# Patient Record
Sex: Male | Born: 1945 | Race: White | Hispanic: No | Marital: Single | Smoking: Never smoker
Health system: Southern US, Community
[De-identification: ages and names within clinical notes are randomized; demographics above are authoritative.]

## PROBLEM LIST (undated history)

## (undated) DIAGNOSIS — E119 Type 2 diabetes mellitus without complications: Secondary | ICD-10-CM

## (undated) DIAGNOSIS — E039 Hypothyroidism, unspecified: Secondary | ICD-10-CM

## (undated) DIAGNOSIS — I10 Essential (primary) hypertension: Secondary | ICD-10-CM

## (undated) HISTORY — PX: OTHER SURGICAL HISTORY: SHX169

---

## 2014-12-26 ENCOUNTER — Inpatient Hospital Stay (HOSPITAL_COMMUNITY): Payer: Medicare Other

## 2014-12-26 ENCOUNTER — Encounter (HOSPITAL_COMMUNITY): Payer: Self-pay | Admitting: Internal Medicine

## 2014-12-26 ENCOUNTER — Inpatient Hospital Stay (HOSPITAL_COMMUNITY)
Admission: EM | Admit: 2014-12-26 | Discharge: 2015-01-07 | DRG: 682 | Disposition: A | Discharge status: Skilled Nursing Facility | Payer: Medicare Other | Source: Other Acute Inpatient Hospital | Attending: Internal Medicine | Admitting: Internal Medicine

## 2014-12-26 DIAGNOSIS — Z4659 Encounter for fitting and adjustment of other gastrointestinal appliance and device: Secondary | ICD-10-CM | POA: Insufficient documentation

## 2014-12-26 DIAGNOSIS — Z9119 Patient's noncompliance with other medical treatment and regimen: Secondary | ICD-10-CM | POA: Diagnosis present

## 2014-12-26 DIAGNOSIS — I10 Essential (primary) hypertension: Secondary | ICD-10-CM | POA: Diagnosis present

## 2014-12-26 DIAGNOSIS — E039 Hypothyroidism, unspecified: Secondary | ICD-10-CM | POA: Diagnosis present

## 2014-12-26 DIAGNOSIS — D638 Anemia in other chronic diseases classified elsewhere: Secondary | ICD-10-CM | POA: Diagnosis present

## 2014-12-26 DIAGNOSIS — Z515 Encounter for palliative care: Secondary | ICD-10-CM | POA: Diagnosis not present

## 2014-12-26 DIAGNOSIS — E669 Obesity, unspecified: Secondary | ICD-10-CM | POA: Diagnosis present

## 2014-12-26 DIAGNOSIS — G9341 Metabolic encephalopathy: Secondary | ICD-10-CM | POA: Diagnosis not present

## 2014-12-26 DIAGNOSIS — J9600 Acute respiratory failure, unspecified whether with hypoxia or hypercapnia: Secondary | ICD-10-CM | POA: Diagnosis not present

## 2014-12-26 DIAGNOSIS — Z794 Long term (current) use of insulin: Secondary | ICD-10-CM | POA: Diagnosis not present

## 2014-12-26 DIAGNOSIS — Z7189 Other specified counseling: Secondary | ICD-10-CM | POA: Insufficient documentation

## 2014-12-26 DIAGNOSIS — N179 Acute kidney failure, unspecified: Secondary | ICD-10-CM | POA: Diagnosis present

## 2014-12-26 DIAGNOSIS — Z8673 Personal history of transient ischemic attack (TIA), and cerebral infarction without residual deficits: Secondary | ICD-10-CM

## 2014-12-26 DIAGNOSIS — I69354 Hemiplegia and hemiparesis following cerebral infarction affecting left non-dominant side: Secondary | ICD-10-CM

## 2014-12-26 DIAGNOSIS — Z833 Family history of diabetes mellitus: Secondary | ICD-10-CM | POA: Diagnosis not present

## 2014-12-26 DIAGNOSIS — E1165 Type 2 diabetes mellitus with hyperglycemia: Secondary | ICD-10-CM | POA: Diagnosis present

## 2014-12-26 DIAGNOSIS — N189 Chronic kidney disease, unspecified: Secondary | ICD-10-CM | POA: Diagnosis not present

## 2014-12-26 DIAGNOSIS — J690 Pneumonitis due to inhalation of food and vomit: Secondary | ICD-10-CM | POA: Diagnosis not present

## 2014-12-26 DIAGNOSIS — T17908A Unspecified foreign body in respiratory tract, part unspecified causing other injury, initial encounter: Secondary | ICD-10-CM | POA: Insufficient documentation

## 2014-12-26 DIAGNOSIS — E875 Hyperkalemia: Secondary | ICD-10-CM | POA: Diagnosis present

## 2014-12-26 DIAGNOSIS — E871 Hypo-osmolality and hyponatremia: Secondary | ICD-10-CM | POA: Diagnosis present

## 2014-12-26 DIAGNOSIS — M109 Gout, unspecified: Secondary | ICD-10-CM | POA: Diagnosis present

## 2014-12-26 DIAGNOSIS — R0981 Nasal congestion: Secondary | ICD-10-CM | POA: Insufficient documentation

## 2014-12-26 DIAGNOSIS — IMO0002 Reserved for concepts with insufficient information to code with codable children: Secondary | ICD-10-CM | POA: Diagnosis present

## 2014-12-26 DIAGNOSIS — E872 Acidosis: Secondary | ICD-10-CM | POA: Diagnosis not present

## 2014-12-26 DIAGNOSIS — Z7982 Long term (current) use of aspirin: Secondary | ICD-10-CM | POA: Diagnosis not present

## 2014-12-26 DIAGNOSIS — Z6827 Body mass index (BMI) 27.0-27.9, adult: Secondary | ICD-10-CM | POA: Diagnosis not present

## 2014-12-26 DIAGNOSIS — Z992 Dependence on renal dialysis: Secondary | ICD-10-CM

## 2014-12-26 DIAGNOSIS — J9690 Respiratory failure, unspecified, unspecified whether with hypoxia or hypercapnia: Secondary | ICD-10-CM

## 2014-12-26 DIAGNOSIS — E8770 Fluid overload, unspecified: Secondary | ICD-10-CM | POA: Diagnosis present

## 2014-12-26 DIAGNOSIS — D631 Anemia in chronic kidney disease: Secondary | ICD-10-CM | POA: Diagnosis present

## 2014-12-26 DIAGNOSIS — Z8249 Family history of ischemic heart disease and other diseases of the circulatory system: Secondary | ICD-10-CM

## 2014-12-26 DIAGNOSIS — N178 Other acute kidney failure: Secondary | ICD-10-CM | POA: Diagnosis not present

## 2014-12-26 HISTORY — DX: Type 2 diabetes mellitus without complications: E11.9

## 2014-12-26 HISTORY — DX: Essential (primary) hypertension: I10

## 2014-12-26 HISTORY — DX: Hypothyroidism, unspecified: E03.9

## 2014-12-26 LAB — CBC WITH DIFFERENTIAL/PLATELET
Basophils Absolute: 0 10*3/uL (ref 0.0–0.1)
Basophils Relative: 0 % (ref 0–1)
Eosinophils Absolute: 0 10*3/uL (ref 0.0–0.7)
Eosinophils Relative: 0 % (ref 0–5)
HCT: 28 % — ABNORMAL LOW (ref 39.0–52.0)
Hemoglobin: 9.1 g/dL — ABNORMAL LOW (ref 13.0–17.0)
LYMPHS PCT: 5 % — AB (ref 12–46)
Lymphs Abs: 0.7 10*3/uL (ref 0.7–4.0)
MCH: 28.5 pg (ref 26.0–34.0)
MCHC: 32.5 g/dL (ref 30.0–36.0)
MCV: 87.8 fL (ref 78.0–100.0)
MONO ABS: 0.1 10*3/uL (ref 0.1–1.0)
Monocytes Relative: 1 % — ABNORMAL LOW (ref 3–12)
NEUTROS PCT: 94 % — AB (ref 43–77)
Neutro Abs: 12.9 10*3/uL — ABNORMAL HIGH (ref 1.7–7.7)
Platelets: 186 10*3/uL (ref 150–400)
RBC: 3.19 MIL/uL — AB (ref 4.22–5.81)
RDW: 16 % — AB (ref 11.5–15.5)
WBC: 13.7 10*3/uL — ABNORMAL HIGH (ref 4.0–10.5)

## 2014-12-26 LAB — COMPREHENSIVE METABOLIC PANEL
ALT: 14 U/L (ref 0–53)
AST: 14 U/L (ref 0–37)
Albumin: 2.7 g/dL — ABNORMAL LOW (ref 3.5–5.2)
Alkaline Phosphatase: 45 U/L (ref 39–117)
Anion gap: 15 (ref 5–15)
BUN: 91 mg/dL — ABNORMAL HIGH (ref 6–23)
CO2: 11 mmol/L — ABNORMAL LOW (ref 19–32)
Calcium: 6.3 mg/dL — CL (ref 8.4–10.5)
Chloride: 111 mEq/L (ref 96–112)
Creatinine, Ser: 5.95 mg/dL — ABNORMAL HIGH (ref 0.50–1.35)
GFR calc Af Amer: 10 mL/min — ABNORMAL LOW (ref >90)
GFR calc non Af Amer: 9 mL/min — ABNORMAL LOW (ref >90)
GLUCOSE: 293 mg/dL — AB (ref 70–99)
POTASSIUM: 5.2 mmol/L — AB (ref 3.5–5.1)
Sodium: 137 mmol/L (ref 135–145)
TOTAL PROTEIN: 5.5 g/dL — AB (ref 6.0–8.3)
Total Bilirubin: 0.5 mg/dL (ref 0.3–1.2)

## 2014-12-26 LAB — BLOOD GAS, ARTERIAL
Acid-base deficit: 14.6 mmol/L — ABNORMAL HIGH (ref 0.0–2.0)
Bicarbonate: 11.8 mEq/L — ABNORMAL LOW (ref 20.0–24.0)
Drawn by: 418751
FIO2: 0.32 %
O2 Saturation: 97.9 %
PATIENT TEMPERATURE: 98.6
PCO2 ART: 30.9 mmHg — AB (ref 35.0–45.0)
PO2 ART: 130 mmHg — AB (ref 80.0–100.0)
TCO2: 12.7 mmol/L (ref 0–100)
pH, Arterial: 7.206 — ABNORMAL LOW (ref 7.350–7.450)

## 2014-12-26 LAB — MRSA PCR SCREENING: MRSA BY PCR: NEGATIVE

## 2014-12-26 LAB — LACTIC ACID, PLASMA: Lactic Acid, Venous: 1.3 mmol/L (ref 0.5–2.2)

## 2014-12-26 LAB — PHOSPHORUS: Phosphorus: 9.7 mg/dL — ABNORMAL HIGH (ref 2.3–4.6)

## 2014-12-26 MED ORDER — RENA-VITE PO TABS
1.0000 | ORAL_TABLET | Freq: Every day | ORAL | Status: DC
  Administered 2014-12-26 – 2015-01-06 (×9): 1 via ORAL
  Filled 2014-12-26 (×15): qty 1

## 2014-12-26 MED ORDER — CALCIUM ACETATE 667 MG PO CAPS
1334.0000 mg | ORAL_CAPSULE | Freq: Three times a day (TID) | ORAL | Status: DC
  Administered 2014-12-27 – 2015-01-05 (×19): 1334 mg via ORAL
  Filled 2014-12-26 (×31): qty 2

## 2014-12-26 MED ORDER — INSULIN ASPART 100 UNIT/ML ~~LOC~~ SOLN
0.0000 [IU] | Freq: Three times a day (TID) | SUBCUTANEOUS | Status: DC
  Administered 2014-12-27: 3 [IU] via SUBCUTANEOUS
  Administered 2014-12-27 (×2): 2 [IU] via SUBCUTANEOUS

## 2014-12-26 MED ORDER — FUROSEMIDE 10 MG/ML IJ SOLN
120.0000 mg | Freq: Three times a day (TID) | INTRAMUSCULAR | Status: DC
  Administered 2014-12-26 – 2014-12-29 (×8): 120 mg via INTRAVENOUS
  Administered 2014-12-29: 07:00:00 via INTRAVENOUS
  Administered 2014-12-29 – 2014-12-30 (×4): 120 mg via INTRAVENOUS
  Filled 2014-12-26 (×16): qty 12

## 2014-12-26 MED ORDER — LEVOTHYROXINE SODIUM 50 MCG PO TABS
50.0000 ug | ORAL_TABLET | Freq: Every day | ORAL | Status: DC
  Administered 2014-12-27: 50 ug via ORAL
  Filled 2014-12-26 (×3): qty 1

## 2014-12-26 MED ORDER — ASPIRIN EC 325 MG PO TBEC
325.0000 mg | DELAYED_RELEASE_TABLET | Freq: Every evening | ORAL | Status: DC
  Administered 2014-12-26 – 2015-01-06 (×9): 325 mg via ORAL
  Filled 2014-12-26 (×14): qty 1

## 2014-12-26 MED ORDER — ACETAMINOPHEN 325 MG PO TABS: 650.0000 mg | ORAL_TABLET | Freq: Four times a day (QID) | ORAL | Status: DC | PRN

## 2014-12-26 MED ORDER — ONDANSETRON HCL 4 MG PO TABS: 4.0000 mg | ORAL_TABLET | Freq: Four times a day (QID) | ORAL | Status: DC | PRN

## 2014-12-26 MED ORDER — ACETAMINOPHEN 650 MG RE SUPP: 650.0000 mg | Freq: Four times a day (QID) | RECTAL | Status: DC | PRN

## 2014-12-26 MED ORDER — ONDANSETRON HCL 4 MG/2ML IJ SOLN: 4.0000 mg | Freq: Four times a day (QID) | INTRAMUSCULAR | Status: DC | PRN

## 2014-12-26 MED ORDER — CEFTRIAXONE SODIUM IN DEXTROSE 20 MG/ML IV SOLN
1.0000 g | INTRAVENOUS | Status: DC
  Administered 2014-12-26 – 2014-12-27 (×2): 1 g via INTRAVENOUS
  Filled 2014-12-26 (×3): qty 50

## 2014-12-26 MED ORDER — ATENOLOL 50 MG PO TABS
50.0000 mg | ORAL_TABLET | Freq: Every evening | ORAL | Status: DC
  Administered 2014-12-26 – 2014-12-27 (×2): 50 mg via ORAL
  Filled 2014-12-26 (×3): qty 1

## 2014-12-26 MED ORDER — HEPARIN SODIUM (PORCINE) 5000 UNIT/ML IJ SOLN
5000.0000 [IU] | Freq: Three times a day (TID) | INTRAMUSCULAR | Status: DC
  Administered 2014-12-26 – 2015-01-07 (×27): 5000 [IU] via SUBCUTANEOUS
  Filled 2014-12-26 (×39): qty 1

## 2014-12-26 MED ORDER — SIMVASTATIN 20 MG PO TABS
20.0000 mg | ORAL_TABLET | Freq: Every evening | ORAL | Status: DC
  Administered 2014-12-26 – 2014-12-27 (×2): 20 mg via ORAL
  Filled 2014-12-26 (×3): qty 1

## 2014-12-26 MED ORDER — INSULIN GLARGINE 100 UNIT/ML ~~LOC~~ SOLN
5.0000 [IU] | Freq: Every day | SUBCUTANEOUS | Status: DC
  Administered 2014-12-26: 5 [IU] via SUBCUTANEOUS
  Filled 2014-12-26 (×2): qty 0.05

## 2014-12-26 MED ORDER — KIDNEY FAILURE BOOK
Freq: Once | Status: AC
  Administered 2014-12-27: 01:00:00
  Filled 2014-12-26: qty 1

## 2014-12-26 MED ORDER — DEXTROSE 5 % IV SOLN
500.0000 mg | INTRAVENOUS | Status: DC
  Administered 2014-12-26 – 2014-12-27 (×2): 500 mg via INTRAVENOUS
  Filled 2014-12-26 (×3): qty 500

## 2014-12-26 MED ORDER — SODIUM CHLORIDE 0.9 % IJ SOLN
3.0000 mL | Freq: Two times a day (BID) | INTRAMUSCULAR | Status: DC
  Administered 2014-12-26 – 2015-01-05 (×18): 3 mL via INTRAVENOUS

## 2014-12-26 NOTE — Consult Note (Signed)
Reason for Consult:ARF, metabolic acidosis, hyperkalemia Referring Physician: Dr. Sabino [name] is an 69 y.o. male.  HPI: Pt is [name] with PMH significant for longstanding, poorly controlled DM, HTN, and CVA with left hemiparesis who presented to [facility] on 12/24/14 with [name] 1 month h/o worsening lower extremity swelling, weakness, and malaise.  He was admitted to [facility] after he was noted to have anasarca, CHF, anemia, ARF and right lower lobe pneumonia.  His labs were significant for [name] hgb 7.6, Scr of 6.1, and [name] CO2 of 10.  He was given [name] blood transfusion and started to develop worsening SOB and was transferred to [facility] for possible hemodialysis as his renal function was not improving and os his worsening pulmonary edema.  Renal ultrasound performed at [facility] revealed no hydronephrosis, Left Kidney 11.2cm, Right Kidney 11.1 with normal echogenicity.  He also had bladder wall thickening and probable non-obstructing stone in right kidney.  He is [name] poor historian but reports that he saw [name] Dr. Alvester Morin in [place], [place] and had labs drawn about [name] year ago.  He denies any mention of kidney disease but did remember that Dr. Alvester Morin told him to take less Alleve so he could try to avoid needing dialysis.  He has no family history of kidney disease.  Also of note, he was taking benazepril prior to admission.  We only have 3 days worth of lab data available at this time and his Scr trend is seen below:  Trend in Creatinine: CREATININE, SER  Date/Time Value Ref Range Status  12/26/2014 08:41 PM 5.95* 0.50 - 1.35 mg/dL Final  2/35/5732 20:25 AM 5.9    12/24/2014  22:25 PM 6.1      PMH:   Past Medical History  Diagnosis Date  . Hypertension   . Hypothyroidism   . Diabetes mellitus without complication     PSH:   Past Surgical History  Procedure Laterality Date  . Chest tube placement      Allergies: No Known Allergies  Medications:   Prior to Admission medications   Medication  Sig Start Date End Date Taking? Authorizing Provider  allopurinol (ZYLOPRIM) 300 MG tablet Take 300 mg by mouth every evening.   Yes Historical Provider, MD  aspirin EC 325 MG tablet Take 325 mg by mouth every evening.   Yes Historical Provider, MD  atenolol (TENORMIN) 50 MG tablet Take 50 mg by mouth every evening.   Yes Historical Provider, MD  benazepril (LOTENSIN) 40 MG tablet Take 40 mg by mouth every evening.   Yes Historical Provider, MD  furosemide (LASIX) 40 MG tablet Take 40 mg by mouth every evening.   Yes Historical Provider, MD  insulin aspart (NOVOLOG FLEXPEN) 100 UNIT/ML FlexPen Inject 2-10 Units into the skin daily with supper. Per sliding scale   Yes Historical Provider, MD  Insulin Glargine (LANTUS SOLOSTAR) 100 UNIT/ML Solostar Pen Inject 18 Units into the skin at bedtime. If blood sugar is >150   Yes Historical Provider, MD  Lactobacillus (ACIDOPHILUS PO) Take 1 tablet by mouth 2 (two) times daily.   Yes Historical Provider, MD  levothyroxine (SYNTHROID, LEVOTHROID) 50 MCG tablet Take 50 mcg by mouth every evening.   Yes Historical Provider, MD  OVER THE COUNTER MEDICATION Take 2 tablets by mouth every evening. Supplement to keep from having heart attack or stroke   Yes Historical Provider, MD  simvastatin (ZOCOR) 20 MG tablet Take 20 mg by mouth every evening.   Yes  Historical Provider, MD    Inpatient medications: . aspirin EC  325 mg Oral QPM  . atenolol  50 mg Oral QPM  . azithromycin  500 mg Intravenous Q24H  . cefTRIAXone (ROCEPHIN)  IV  1 g Intravenous Q24H  . furosemide  120 mg Intravenous Q8H  . heparin  5,000 Units Subcutaneous 3 times per day  . [START ON 12/27/2014] insulin aspart  0-9 Units Subcutaneous TID WC  . insulin glargine  5 Units Subcutaneous QHS  . [START ON 12/27/2014] levothyroxine  50 mcg Oral QAC breakfast  . simvastatin  20 mg Oral QPM  . sodium chloride  3 mL Intravenous Q12H    Discontinued Meds:   Medications Discontinued During This  Encounter  Medication Reason  . SIMVASTATIN PO Entry Error  . FUROSEMIDE PO Entry Error  . ATENOLOL PO Entry Error  . ALLOPURINOL PO Entry Error  . BENAZEPRIL HCL PO Entry Error    Social History:  reports that he has never smoked. He does not have any smokeless tobacco history on file. He reports that he drinks alcohol. His drug history is not on file.  Family History:   Family History  Problem Relation Age of Onset  . Diabetes Mellitus II Father   . CAD Father     Pertinent items are noted in HPI. Weight change:   Intake/Output Summary (Last 24 hours) at 12/26/14 2202 Last data filed at 12/26/14 2100  Gross per 24 hour  Intake      0 ml  Output    100 ml  Net   -100 ml   BP 153/86 mmHg  Pulse 80  Temp(Src) 98.3 F (36.8 C) (Oral)  Resp 16  Ht $R'5\' 8"'mh$  (1.727 m)  Wt 100.3 kg (221 lb 1.9 oz)  BMI 33.63 kg/m2  SpO2 99% Filed Vitals:   12/26/14 2000  BP: 153/86  Pulse: 80  Temp: 98.3 F (36.8 C)  TempSrc: Oral  Resp: 16  Height: $Remove'5\' 8"'gasEzFo$  (1.727 m)  Weight: 100.3 kg (221 lb 1.9 oz)  SpO2: 99%     General appearance: fatigued, no distress and slowed mentation Head: Normocephalic, without obvious abnormality, atraumatic Eyes: negative findings: lids and lashes normal, conjunctivae and sclerae normal and corneas clear Resp: rales bilaterally and rhonchi bilaterally Cardio: regular rate and rhythm and no rub GI: soft, non-tender; bowel sounds normal; no masses,  no organomegaly Extremities: edema 3+ pitting edema to umbillicus and multiple sores on left hand and lower extremities in various stages of healing Neurologic: Motor: left-sided weakness  Labs: Basic Metabolic Panel:  Recent Labs Lab 12/26/14 2041  NA 137  K 5.2*  CL 111  CO2 11*  GLUCOSE 293*  BUN 91*  CREATININE 5.95*  ALBUMIN 2.7*  CALCIUM 6.3*  PHOS 9.7*   Liver Function Tests:  Recent Labs Lab 12/26/14 2041  AST 14  ALT 14  ALKPHOS 45  BILITOT 0.5  PROT 5.5*  ALBUMIN 2.7*   No  results for input(s): LIPASE, AMYLASE in the last 168 hours. No results for input(s): AMMONIA in the last 168 hours. CBC:  Recent Labs Lab 12/26/14 2041  WBC 13.7*  NEUTROABS 12.9*  HGB 9.1*  HCT 28.0*  MCV 87.8  PLT 186   PT/INR: $Remove'@LABRCNTIP'YbEkJAb$ (inr:5) Cardiac Enzymes: )No results for input(s): CKTOTAL, CKMB, CKMBINDEX, TROPONINI in the last 168 hours. CBG: No results for input(s): GLUCAP in the last 168 hours.  Iron Studies: No results for input(s): IRON, TIBC, TRANSFERRIN, FERRITIN in the last 168  hours.  Xrays/Other Studies: Dg Chest Port 1 View  12/26/2014   CLINICAL DATA:  Fluid overload.  Shortness of breath.  EXAM: PORTABLE CHEST - 1 VIEW  COMPARISON:  Earlier the same day at 05:12 hr  FINDINGS: Portable AP view at 1956 hr: Low lung volumes persist. There is improving right basilar aeration with decreasing right basilar airspace disease. Unchanged left basilar airspace disease. Pulmonary vascular congestion, unchanged from prior. Cardiomediastinal contours are unchanged. Question small bilateral pleural effusions.  IMPRESSION: Improving right basilar aeration with decreasing airspace opacity. Question small bilateral pleural effusions.   Electronically Signed   By: Jeb Levering M.D.   On: 12/26/2014 20:37     Assessment/Plan: 1.  ARF vs. AKI/CKD vs. Chronic progression of CKD- pt is [name] poor historian and no other labs are available for review, however he has [name] long history of HTN (with evidence of LVH), and poorly controlled DM along with normocytic anemia.   1. Will order some serologies and urine studies but will definitely need to obtain his medical records from Dr. Gloriann Loan in [place], [place] to assist in determining the chronicity/severity of his kidney disease.   2. AKI/CKD is most likely related to diabetes and HTN in setting of ACE-Inhibitor and NSAIDs as well as decompensated CHF.  Will also need to r/o multiple myeloma 2. Hyperkalemia- related to #1 1. On bicarb replacement  as well as lasix, will follow 3. Metabolic acidosis- due to #1 1. On bicarb 4. CHF- agree with lasix $RemoveBe'120mg'fVztOFpgl$  IV tid and follow UOP, daily weights and Scr 5. Anemia- normocytic.  R/o multiple myeloma and follow H/H 6. HTN- agree with holding ACE but would resume other BP meds 7. 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 Pulmonary HTN- moderate on ECHO, ?OSA 10. CAP- per primary svc (or possibly asymmetric pulmonary edema), will follow CXR after diuresis 11. CVA with left-sided weakness 12. Hypothyroidism- TSH 8.66 on synthroid   Edward [name] 12/26/2014, 10:02 PM

## 2014-12-26 NOTE — H&P (Signed)
[facility] History and Physical  Edward SonsKenneth L Goar AVW:[id number] DOB: 10/24/1946 DOA: 12/26/2014  Referring physician: Patient was [facility]. PCP: Kendell BaneBELL, WILLIAM, MD   Chief Complaint: Worsening renal function.  HPI: Edward [name] is a 69 y.o. male with history of CVA and left-sided hemiparesis, diabetes mellitus type 2, hypertension and hypothyroidism was taken to [facility] 2 days ago after patient has been having increasing weakness with lower extremity edema. Patient was found to have elevated creatinine and low hemoglobin. His textile also was showing which is concerning for pneumonia. Patient did receive 2 units of PRBC at [facility] for the anemia. Patient's respiratory status got worse and and was given Lasix and transferred to [facility] for further management of the patient's worsening renal function. [facility]D echo done [facility] showed EF of 55-60% with moderate pulmonary hypertension and concentrate LVH. Renal sonogram showed no hydronephrosis. Hemoglobin on admission was 7.6. Creatinine was 6.1 with a bicarbonate of 13 and ABG showing a pH of 7.1. Patient states he is not aware of patient having any kidney problems. Patient's home medication list shows that patient is on ACE inhibitor. On exam patient is not in distress but does have gurgling breath sounds. Patient has significant edema in the both lower extremities and has distended abdomen.   Review of Systems: As presented in the history of presenting illness, rest negative.  Past Medical History  Diagnosis Date  . Hypertension   . Hypothyroidism   . Diabetes mellitus without complication    Past Surgical History  Procedure Laterality Date  . Chest tube placement     Social History:  reports that he has never smoked. He does not have any smokeless tobacco history on file. He reports that he drinks alcohol. His drug history is not on file. Where does patient live home. Can  patient participate in ADLs? Yes.  No Known Allergies  Family History:  Family History  Problem Relation Age of Onset  . Diabetes Mellitus II Father   . CAD Father       Prior to Admission medications   Medication Sig Start Date End Date Taking? Authorizing Provider  allopurinol (ZYLOPRIM) 300 MG tablet Take 300 mg by mouth every evening.   Yes Historical Provider, MD  aspirin EC 325 MG tablet Take 325 mg by mouth every evening.   Yes Historical Provider, MD  atenolol (TENORMIN) 50 MG tablet Take 50 mg by mouth every evening.   Yes Historical Provider, MD  benazepril (LOTENSIN) 40 MG tablet Take 40 mg by mouth every evening.   Yes Historical Provider, MD  furosemide (LASIX) 40 MG tablet Take 40 mg by mouth every evening.   Yes Historical Provider, MD  insulin aspart (NOVOLOG FLEXPEN) 100 UNIT/ML FlexPen Inject 2-10 Units into the skin daily with supper. Per sliding scale   Yes Historical Provider, MD  Insulin Glargine (LANTUS SOLOSTAR) 100 UNIT/ML Solostar Pen Inject 18 Units into the skin at bedtime. If blood sugar is >150   Yes Historical Provider, MD  Lactobacillus (ACIDOPHILUS PO) Take 1 tablet by mouth 2 (two) times daily.   Yes Historical Provider, MD  levothyroxine (SYNTHROID, LEVOTHROID) 50 MCG tablet Take 50 mcg by mouth every evening.   Yes Historical Provider, MD  OVER THE COUNTER MEDICATION Take 2 tablets by mouth every evening. Supplement to keep from having heart attack or stroke   Yes Historical Provider, MD  simvastatin (ZOCOR) 20 MG tablet Take 20 mg by mouth every evening.  Yes Historical Provider, MD    Physical Exam: Filed Vitals:   12/26/14 2000  BP: 153/86  Pulse: 80  Temp: 98.3 F (36.8 C)  TempSrc: Oral  Resp: 16  Height:  (1.727 m)  Weight: 100.3 kg (221 lb 1.9 oz)  SpO2: 99%     General:  Moderately built and nourished.  Eyes: Anicteric no pallor.  ENT: No discharge from ears eyes nose or mouth.  Neck: No mass felt. Mildly elevated  JVD.  Cardiovascular: S1 and S2 heard.  Respiratory: No rhonchi or crepitations.  Abdomen: Mildly distended no guarding or rigidity.  Skin: Multiple scratches which patient states he got from his cats.  Musculoskeletal: Bilateral lower extremity edema.  Psychiatric: Appears normal.  Neurologic: Alert awake oriented to time place and person. Has left-sided hemiparesis from a previous stroke.  Labs on Admission:  Basic Metabolic Panel: No results for input(s): NA, K, CL, CO2, GLUCOSE, BUN, CREATININE, CALCIUM, MG, PHOS in the last 168 hours. Liver Function Tests: No results for input(s): AST, ALT, ALKPHOS, BILITOT, PROT, ALBUMIN in the last 168 hours. No results for input(s): LIPASE, AMYLASE in the last 168 hours. No results for input(s): AMMONIA in the last 168 hours. CBC: No results for input(s): WBC, NEUTROABS, HGB, HCT, MCV, PLT in the last 168 hours. Cardiac Enzymes: No results for input(s): CKTOTAL, CKMB, CKMBINDEX, TROPONINI in the last 168 hours.  BNP (last 3 results) No results for input(s): PROBNP in the last 8760 hours. CBG: No results for input(s): GLUCAP in the last 168 hours.  Radiological Exams on Admission: Dg Chest Port 1 View  12/26/2014   CLINICAL DATA:  Fluid overload.  Shortness of breath.  EXAM: PORTABLE CHEST - 1 VIEW  COMPARISON:  Earlier the same day at 05:12 hr  FINDINGS: Portable AP view at 1956 hr: Low lung volumes persist. There is improving right basilar aeration with decreasing right basilar airspace disease. Unchanged left basilar airspace disease. Pulmonary vascular congestion, unchanged from prior. Cardiomediastinal contours are unchanged. Question small bilateral pleural effusions.  IMPRESSION: Improving right basilar aeration with decreasing airspace opacity. Question small bilateral pleural effusions.   Electronically Signed   By: Rubye Oaks M.D.   On: 12/26/2014 20:37    EKG: Independently reviewed. Normal sinus rhythm with pulmonary  disease pattern. Right axis deviation.  Assessment/Plan Principal Problem:   Acute renal failure Active Problems:   Essential hypertension   Anemia in chronic kidney disease   Diabetes mellitus type 2, uncontrolled   Hypothyroidism   History of stroke   1. Acute renal failure - probably on chronic kidney disease. Patient states that he is making urine. Renal sonogram did not show any obstruction. Patient is acidotic with mild hyperkalemia. I did discuss with on-call nephrologist Dr.Coladonato will be seeing patient in consult. At this time Dr. Arrie Aran has advised to place patient on Lasix 120 mg IV every 8 hourly." The following day, output and metabolic panel. I have ordered an ABG to check patient's pH. Repeat labs including metabolic panel CBC. Discontinue ace inhibitors. 2. Anemia probably from chronic kidney disease - patient did receive 2 units of PRBC at [facility]. Repeat CBC and closely observe. 3. Metabolic acidosis from renal failure - ABGs pending. May need bicarbonate. 4. Pneumonia - chest x-ray showing possible pneumonia patient does have productive cough. At this time we will continue with ceftriaxone and Zithromax. Check urine Legionella and strep antigen. 5. CHF with pulmonary hypertension per 2-D echo done at [facility] which  showed EF of 55-60% - continue Lasix and closely following day output. 6. Hypothyroidism on Synthroid. 7. Diabetes mellitus type 2 uncontrolled - patient is on Lantus at this time I'm not sure patient's by mouth intake and repeat labs are been ordered. For now I have decreased Lantus dose may need to go back to home dose if patient's blood sugar remains high. 8. History of CVA with left-sided hemiparesis - on antiplatelet agents and statins. 9. History of gout - for now holding allopurinol. Further recommendations per nephrologist.   DVT Prophylaxis heparin.  Code Status: Full code.  Family Communication: Patient's family at the  bedside.  Disposition Plan: Admit to inpatient.    Juliany Daughety N. [facility] Pager [phone].  If 7PM-7AM, please contact night-coverage [email or web address] Password [facility] 12/26/2014, 8:43 PM

## 2014-12-26 NOTE — Progress Notes (Signed)
Pt arrived [facility] with [facility]. CHG bath done. Pt has scratches all over body in various areas. 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 Alert/oriented to nurse call system.

## 2014-12-27 DIAGNOSIS — J189 Pneumonia, unspecified organism: Secondary | ICD-10-CM

## 2014-12-27 LAB — CBC
HCT: 25.6 % — ABNORMAL LOW (ref 39.0–52.0)
HEMOGLOBIN: 8.4 g/dL — AB (ref 13.0–17.0)
MCH: 29.6 pg (ref 26.0–34.0)
MCHC: 32.8 g/dL (ref 30.0–36.0)
MCV: 90.1 fL (ref 78.0–100.0)
Platelets: 161 10*3/uL (ref 150–400)
RBC: 2.84 MIL/uL — ABNORMAL LOW (ref 4.22–5.81)
RDW: 16.2 % — ABNORMAL HIGH (ref 11.5–15.5)
WBC: 11.7 10*3/uL — AB (ref 4.0–10.5)

## 2014-12-27 LAB — RENAL FUNCTION PANEL
Albumin: 2.4 g/dL — ABNORMAL LOW (ref 3.5–5.2)
Anion gap: 14 (ref 5–15)
BUN: 92 mg/dL — ABNORMAL HIGH (ref 6–23)
CO2: 12 mmol/L — ABNORMAL LOW (ref 19–32)
Calcium: 6.3 mg/dL — CL (ref 8.4–10.5)
Chloride: 112 mEq/L (ref 96–112)
Creatinine, Ser: 5.93 mg/dL — ABNORMAL HIGH (ref 0.50–1.35)
GFR calc Af Amer: 10 mL/min — ABNORMAL LOW (ref >90)
GFR calc non Af Amer: 9 mL/min — ABNORMAL LOW (ref >90)
Glucose, Bld: 197 mg/dL — ABNORMAL HIGH (ref 70–99)
Phosphorus: 9.7 mg/dL — ABNORMAL HIGH (ref 2.3–4.6)
Potassium: 5.1 mmol/L (ref 3.5–5.1)
Sodium: 138 mmol/L (ref 135–145)

## 2014-12-27 LAB — IRON AND TIBC
Iron: 35 ug/dL — ABNORMAL LOW (ref 42–165)
Saturation Ratios: 17 % — ABNORMAL LOW (ref 20–55)
TIBC: 205 ug/dL — ABNORMAL LOW (ref 215–435)
UIBC: 170 ug/dL (ref 125–400)

## 2014-12-27 LAB — GLUCOSE, CAPILLARY
Glucose-Capillary: 220 mg/dL — ABNORMAL HIGH (ref 70–99)
Glucose-Capillary: 173 mg/dL — ABNORMAL HIGH (ref 70–99)
Glucose-Capillary: 166 mg/dL — ABNORMAL HIGH (ref 70–99)
Glucose-Capillary: 203 mg/dL — ABNORMAL HIGH (ref 70–99)

## 2014-12-27 LAB — SODIUM, URINE, RANDOM: Sodium, Ur: 53 mmol/L

## 2014-12-27 LAB — URINALYSIS, ROUTINE W REFLEX MICROSCOPIC
Bilirubin Urine: NEGATIVE
GLUCOSE, UA: 250 mg/dL — AB
Ketones, ur: NEGATIVE mg/dL
LEUKOCYTES UA: NEGATIVE
NITRITE: NEGATIVE
Protein, ur: 100 mg/dL — AB
Specific Gravity, Urine: 1.015 (ref 1.005–1.030)
Urobilinogen, UA: 0.2 mg/dL (ref 0.0–1.0)
pH: 5 (ref 5.0–8.0)

## 2014-12-27 LAB — CK: Total CK: 620 U/L — ABNORMAL HIGH (ref 7–232)

## 2014-12-27 LAB — URINE MICROSCOPIC-ADD ON

## 2014-12-27 LAB — PROTEIN / CREATININE RATIO, URINE
Creatinine, Urine: 64.08 mg/dL
Protein Creatinine Ratio: 2.98 — ABNORMAL HIGH (ref 0.00–0.15)
Total Protein, Urine: 191 mg/dL

## 2014-12-27 LAB — FERRITIN: Ferritin: 178 ng/mL (ref 22–322)

## 2014-12-27 LAB — HEMOGLOBIN A1C
Hgb A1c MFr Bld: 6.2 % — ABNORMAL HIGH (ref <5.7)
Mean Plasma Glucose: 131 mg/dL — ABNORMAL HIGH (ref <117)

## 2014-12-27 LAB — CREATININE, URINE, RANDOM: Creatinine, Urine: 63.24 mg/dL

## 2014-12-27 LAB — STREP PNEUMONIAE URINARY ANTIGEN: STREP PNEUMO URINARY ANTIGEN: NEGATIVE

## 2014-12-27 LAB — VITAMIN B12: Vitamin B-12: 801 pg/mL (ref 211–911)

## 2014-12-27 MED ORDER — INSULIN GLARGINE 100 UNIT/ML ~~LOC~~ SOLN
10.0000 [IU] | Freq: Every day | SUBCUTANEOUS | Status: DC
  Administered 2014-12-27: 10 [IU] via SUBCUTANEOUS
  Filled 2014-12-27 (×2): qty 0.1

## 2014-12-27 MED ORDER — CETYLPYRIDINIUM CHLORIDE 0.05 % MT LIQD
7.0000 mL | Freq: Two times a day (BID) | OROMUCOSAL | Status: DC
  Administered 2014-12-27 (×2): 7 mL via OROMUCOSAL

## 2014-12-27 MED ORDER — GUAIFENESIN-DM 100-10 MG/5ML PO SYRP
5.0000 mL | ORAL_SOLUTION | Freq: Four times a day (QID) | ORAL | Status: AC | PRN
  Administered 2014-12-27 (×2): 5 mL via ORAL
  Filled 2014-12-27 (×4): qty 5

## 2014-12-27 MED ORDER — BUDESONIDE 0.25 MG/2ML IN SUSP
0.2500 mg | Freq: Two times a day (BID) | RESPIRATORY_TRACT | Status: DC
  Administered 2014-12-27 – 2014-12-28 (×2): 0.25 mg via RESPIRATORY_TRACT
  Filled 2014-12-27 (×5): qty 2

## 2014-12-27 MED ORDER — SODIUM BICARBONATE 650 MG PO TABS
650.0000 mg | ORAL_TABLET | Freq: Two times a day (BID) | ORAL | Status: DC
  Administered 2014-12-27 – 2015-01-07 (×18): 650 mg via ORAL
  Filled 2014-12-27 (×25): qty 1

## 2014-12-27 NOTE — Progress Notes (Signed)
[facility] ROUNDING NOTE   Subjective:   Interval History:  Poor historian but appears in no distress. Discussed dialysis with patient who seems to understand implications   Objective:  Vital signs in last 24 hours:  Temp:  [97.4 F (36.3 C)-98.6 F (37 C)] 97.4 F (36.3 C) (01/12 0808) Pulse Rate:  [66-80] 69 (01/12 0808) Resp:  [13-17] 15 (01/12 0808) BP: (121-153)/(66-107) 151/69 mmHg (01/12 0808) SpO2:  [96 %-99 %] 96 % (01/12 0808) Weight:  [100.3 kg (221 lb 1.9 oz)] 100.3 kg (221 lb 1.9 oz) (01/11 2000)  Weight change:  Filed Weights   12/26/14 2000  Weight: 100.3 kg (221 lb 1.9 oz)    Intake/Output: I/O last 3 completed shifts: In: 602 [P.O.:240; IV Piggyback:362] Out: 100 [Urine:100]   Intake/Output this shift:  Total I/O In: 243 [P.O.:240; I.V.:3] Out: 150 [Urine:150]  CVS- RRR no rub RS- some basal rales ABD- BS present soft non-distended EXT- 3 + edema   Basic Metabolic Panel:  Recent Labs Lab 12/26/14 2041 12/27/14 0349  NA 137 138  K 5.2* 5.1  CL 111 112  CO2 11* 12*  GLUCOSE 293* 197*  BUN 91* 92*  CREATININE 5.95* 5.93*  CALCIUM 6.3* 6.3*  PHOS 9.7* 9.7*    Liver Function Tests:  Recent Labs Lab 12/26/14 2041 12/27/14 0349  AST 14  --   ALT 14  --   ALKPHOS 45  --   BILITOT 0.5  --   PROT 5.5*  --   ALBUMIN 2.7* 2.4*   No results for input(s): LIPASE, AMYLASE in the last 168 hours. No results for input(s): AMMONIA in the last 168 hours.  CBC:  Recent Labs Lab 12/26/14 2041 12/27/14 0349  WBC 13.7* 11.7*  NEUTROABS 12.9*  --   HGB 9.1* 8.4*  HCT 28.0* 25.6*  MCV 87.8 90.1  PLT 186 161    Cardiac Enzymes:  Recent Labs Lab 12/27/14 0349  CKTOTAL 620*    BNP: Invalid input(s): POCBNP  CBG:  Recent Labs Lab 12/26/14 2220  GLUCAP 220*    Microbiology: Results for orders placed or performed during the hospital encounter of 12/26/14  MRSA PCR Screening     Status: None   Collection  Time: 12/26/14  7:57 PM  Result Value Ref Range Status   MRSA by PCR NEGATIVE NEGATIVE Final    Comment:        The GeneXpert MRSA Assay (FDA approved for NASAL specimens only), is one component of a comprehensive MRSA colonization surveillance program. It is not intended to diagnose MRSA infection nor to guide or monitor treatment for MRSA infections.     Coagulation Studies: No results for input(s): LABPROT, INR in the last 72 hours.  Urinalysis:  Recent Labs  12/27/14 0849  COLORURINE YELLOW  LABSPEC 1.015  PHURINE 5.0  GLUCOSEU 250*  HGBUR MODERATE*  BILIRUBINUR NEGATIVE  KETONESUR NEGATIVE  PROTEINUR 100*  UROBILINOGEN 0.2  NITRITE NEGATIVE  LEUKOCYTESUR NEGATIVE      Imaging: Dg Chest Port 1 View  12/26/2014   CLINICAL DATA:  Fluid overload.  Shortness of breath.  EXAM: PORTABLE CHEST - 1 VIEW  COMPARISON:  Earlier the same day at 05:12 hr  FINDINGS: Portable AP view at 1956 hr: Low lung volumes persist. There is improving right basilar aeration with decreasing right basilar airspace disease. Unchanged left basilar airspace disease. Pulmonary vascular congestion, unchanged from prior. Cardiomediastinal contours are unchanged. Question small bilateral pleural effusions.  IMPRESSION: Improving right basilar  aeration with decreasing airspace opacity. Question small bilateral pleural effusions.   Electronically Signed   By: Rubye OaksMelanie  Ehinger M.D.   On: 12/26/2014 20:37     Medications:     . antiseptic oral rinse  7 mL Mouth Rinse BID  . aspirin EC  325 mg Oral QPM  . atenolol  50 mg Oral QPM  . azithromycin  500 mg Intravenous Q24H  . calcium acetate  1,334 mg Oral TID WC  . cefTRIAXone (ROCEPHIN)  IV  1 g Intravenous Q24H  . furosemide  120 mg Intravenous Q8H  . heparin  5,000 Units Subcutaneous 3 times per day  . insulin aspart  0-9 Units Subcutaneous TID WC  . insulin glargine  5 Units Subcutaneous QHS  . levothyroxine  50 mcg Oral QAC breakfast  .  multivitamin  1 tablet Oral QHS  . simvastatin  20 mg Oral QPM  . sodium chloride  3 mL Intravenous Q12H   acetaminophen **OR** acetaminophen, guaiFENesin-dextromethorphan, ondansetron **OR** ondansetron (ZOFRAN) IV  Assessment/ Plan:   ARF vs CKD  This may be some progression of disease and serologies are pending. I shall call Dr Alvester [place]Bell in [place].   Hyerkalemia better  Metabolic acidosis will continue to follow  Volume overload  Lasix IV monitoring Is and Os  Dm  Per primary    LOS: 1 Edward [name] W @TODAY @12 :13 PM

## 2014-12-27 NOTE — Progress Notes (Signed)
Encouraged patient to use the urinal when he urinates but he is non compliant, wants to go to the bathroom and refused to use the urinal.  Advised patient that it is very difficult for the staff to monitor his output.  He is already on Lasix IV.  When he goes to the bathroom he claimed that he did not do anything.

## 2014-12-27 NOTE — Progress Notes (Signed)
Patient has refused to use urinal all but one time this shift and that was when he was first arrived. On that occasion he used the urinal and voided 100cc; however the other two times patient would not use the urinal.  pt educated on the importance of using the urinal due to kidney function, and multiple urine test that need to be collected. Will continue to educate patient on the importance of accurate output measurement and need to collect urinary test.  Adria DillKimberly Ndidi Nesby RN

## 2014-12-27 NOTE — Progress Notes (Signed)
[facility] PROGRESS NOTE  Edward SonsKenneth L [name] ZOX:[id number] DOB: 1946/06/25 DOA: 12/26/2014 PCP: Kendell BaneBELL, WILLIAM, MD  Assessment/Plan:  1. Acute renal failure - probably on chronic kidney disease. Patient states that he is making a little more urine. Renal sonogram did not show any obstruction. Will continue lasix and follow renal recommendations. Still with significant signs of fluid overload and Cr in the 5.93 range. Will start bicarbonate. Limiting/avoiding nephrotoxic agents. 2. Anemia probably from chronic kidney disease - patient did receive 2 units of PR[facility]. Repeated CBC with Hgb 8.4; epogen and iron therapy per renal service description. 3. Metabolic acidosis from renal failure - ABGs with PH of 7.2 and CO2 of 12; will start bicarb 650mg  BID 4. Pneumonia - chest x-ray showing possible pneumonia patient does have productive cough. Will continue with ceftriaxone and Zithromax. Check urine Legionella and strep antigen. Continue guaifenesin   5. CHF with pulmonary hypertension per 2-D echo done at [facility] which showed EF of 55-60% - continue Lasix as recommended by renal service. Close follow up of I's and O's, daily weight and low sodium diet. 6. Hypothyroidism: will continue Synthroid. 7. Diabetes mellitus type 2 uncontrolled - A1C 6.2; CBG's in the 190-200 range. Will continue SSI; lantus changed to 10 units for better control. 8. History of CVA with left-sided hemiparesis - will continue antiplatelet agents and statins. No new deficit 9. History of gout - for now holding allopurinol. Further recommendations per nephrologist.  Code Status: Full Family Communication: no family at bedside Disposition Plan: will transfer out of stepdown unit to telemetry   Consultants:  Nephrology   Procedures:  Rocephin  zithromax  HPI/Subjective: Still with some coughing, no fever. Reports increase in urine output.  Objective: Filed Vitals:   12/27/14 1224   BP: 164/61  Pulse: 70  Temp: 97.5 F (36.4 C)  Resp: 16    Intake/Output Summary (Last 24 hours) at 12/27/14 1309 Last data filed at 12/27/14 [id number]  Gross per 24 hour  Intake    845 ml  Output    250 ml  Net    595 ml   Filed Weights   12/26/14 2000  Weight: 100.3 kg (221 lb 1.9 oz)    Exam:   General:  Afebrile,no fever; feeling somewhat better. Denies CP  Cardiovascular:S1 and S2, no rubs or gallops  Respiratory: no crackles, S1 and S2; no rubs; positive rhonchi   Abdomen: soft, NT, positive BS; no distension   Musculoskeletal: 2-3++ edema  Data Reviewed: Basic Metabolic Panel:  Recent Labs Lab 12/26/14 2041 12/27/14 0349  NA 137 138  K 5.2* 5.1  CL 111 112  CO2 11* 12*  GLUCOSE 293* 197*  BUN 91* 92*  CREATININE 5.95* 5.93*  CALCIUM 6.3* 6.3*  PHOS 9.7* 9.7*   Liver Function Tests:  Recent Labs Lab 12/26/14 2041 12/27/14 0349  AST 14  --   ALT 14  --   ALKPHOS 45  --   BILITOT 0.5  --   PROT 5.5*  --   ALBUMIN 2.7* 2.4*   CBC:  Recent Labs Lab 12/26/14 2041 12/27/14 0349  WBC 13.7* 11.7*  NEUTROABS 12.9*  --   HGB 9.1* 8.4*  HCT 28.0* 25.6*  MCV 87.8 90.1  PLT 186 161   Cardiac Enzymes:  Recent Labs Lab 12/27/14 0349  CKTOTAL 620*   CBG:  Recent Labs Lab 12/26/14 2220 12/27/14 0807 12/27/14 1227  GLUCAP 220* 173* 166*    Recent Results (from the  past 240 hour(s))  MRSA PCR Screening     Status: None   Collection Time: 12/26/14  7:57 PM  Result Value Ref Range Status   MRSA by PCR NEGATIVE NEGATIVE Final    Comment:        The GeneXpert MRSA Assay (FDA approved for NASAL specimens only), is one component of a comprehensive MRSA colonization surveillance program. It is not intended to diagnose MRSA infection nor to guide or monitor treatment for MRSA infections.      Studies: Dg Chest Port 1 View  12/26/2014   CLINICAL DATA:  Fluid overload.  Shortness of breath.  EXAM: PORTABLE CHEST - 1 VIEW   COMPARISON:  Earlier the same day at 05:12 hr  FINDINGS: Portable AP view at 1956 hr: Low lung volumes persist. There is improving right basilar aeration with decreasing right basilar airspace disease. Unchanged left basilar airspace disease. Pulmonary vascular congestion, unchanged from prior. Cardiomediastinal contours are unchanged. Question small bilateral pleural effusions.  IMPRESSION: Improving right basilar aeration with decreasing airspace opacity. Question small bilateral pleural effusions.   Electronically Signed   By: Rubye Oaks M.D.   On: 12/26/2014 20:37    Scheduled Meds: . antiseptic oral rinse  7 mL Mouth Rinse BID  . aspirin EC  325 mg Oral QPM  . atenolol  50 mg Oral QPM  . azithromycin  500 mg Intravenous Q24H  . budesonide (PULMICORT) nebulizer solution  0.25 mg Nebulization BID  . calcium acetate  1,334 mg Oral TID WC  . cefTRIAXone (ROCEPHIN)  IV  1 g Intravenous Q24H  . furosemide  120 mg Intravenous Q8H  . heparin  5,000 Units Subcutaneous 3 times per day  . insulin aspart  0-9 Units Subcutaneous TID WC  . insulin glargine  5 Units Subcutaneous QHS  . levothyroxine  50 mcg Oral QAC breakfast  . multivitamin  1 tablet Oral QHS  . simvastatin  20 mg Oral QPM  . sodium chloride  3 mL Intravenous Q12H   Continuous Infusions:   Principal Problem:   Acute renal failure Active Problems:   Essential hypertension   Anemia in chronic kidney disease   Diabetes mellitus type 2, uncontrolled   Hypothyroidism   History of stroke    Time spent: 30 minutes    Vassie Loll  [facility] Pager [phone]. If 7PM-7AM, please contact night-coverage at [email or web address], password [facility] 12/27/2014, 1:09 PM  LOS: 1 day

## 2014-12-27 NOTE — Progress Notes (Signed)
CRITICAL VALUE ALERT  Critical value received:  Calcium 6.3   Date of notification:  12/27/14  Time of notification:  0613  Critical value read back:Yes.    Nurse who received alert:  Adria DillKimberly Deisi Salonga  MD notified (1st page):  Donnamarie PoagK. Kirby NP  Time of first page:  0615  MD notified (2nd page):  Time of second page:  Responding MD:  Donnamarie PoagK Kirby NP  Time MD responded:  [phone]

## 2014-12-27 NOTE — Progress Notes (Signed)
Transferred pt via wheelchair to [id number] without complication. RN notified.  Alba DestineMisty L Ennis, RN, BSN

## 2014-12-28 ENCOUNTER — Inpatient Hospital Stay (HOSPITAL_COMMUNITY): Payer: Medicare Other

## 2014-12-28 DIAGNOSIS — Z8673 Personal history of transient ischemic attack (TIA), and cerebral infarction without residual deficits: Secondary | ICD-10-CM

## 2014-12-28 DIAGNOSIS — J690 Pneumonitis due to inhalation of food and vomit: Secondary | ICD-10-CM

## 2014-12-28 DIAGNOSIS — N178 Other acute kidney failure: Secondary | ICD-10-CM

## 2014-12-28 LAB — RENAL FUNCTION PANEL
Albumin: 2.6 g/dL — ABNORMAL LOW (ref 3.5–5.2)
Anion gap: 13 (ref 5–15)
BUN: 107 mg/dL — AB (ref 6–23)
CALCIUM: 6.2 mg/dL — AB (ref 8.4–10.5)
CHLORIDE: 107 meq/L (ref 96–112)
CO2: 11 mmol/L — ABNORMAL LOW (ref 19–32)
CREATININE: 6.5 mg/dL — AB (ref 0.50–1.35)
GFR calc Af Amer: 9 mL/min — ABNORMAL LOW (ref >90)
GFR calc non Af Amer: 8 mL/min — ABNORMAL LOW (ref >90)
GLUCOSE: 155 mg/dL — AB (ref 70–99)
Phosphorus: 9.4 mg/dL — ABNORMAL HIGH (ref 2.3–4.6)
Potassium: 5 mmol/L (ref 3.5–5.1)
Sodium: 131 mmol/L — ABNORMAL LOW (ref 135–145)

## 2014-12-28 LAB — URINALYSIS, ROUTINE W REFLEX MICROSCOPIC
BILIRUBIN URINE: NEGATIVE
GLUCOSE, UA: 100 mg/dL — AB
Ketones, ur: NEGATIVE mg/dL
Leukocytes, UA: NEGATIVE
Nitrite: NEGATIVE
PH: 5 (ref 5.0–8.0)
Protein, ur: >300 mg/dL — AB
SPECIFIC GRAVITY, URINE: 1.013 (ref 1.005–1.030)
Urobilinogen, UA: 0.2 mg/dL (ref 0.0–1.0)

## 2014-12-28 LAB — BLOOD GAS, ARTERIAL
Acid-base deficit: 9.2 mmol/L — ABNORMAL HIGH (ref 0.0–2.0)
Bicarbonate: 16.8 mEq/L — ABNORMAL LOW (ref 20.0–24.0)
Drawn by: 36496
FIO2: 1 %
LHR: 14 {breaths}/min
MECHVT: 550 mL
O2 Saturation: 99.5 %
PATIENT TEMPERATURE: 98.6
PEEP/CPAP: 5 cmH2O
TCO2: 18.1 mmol/L (ref 0–100)
pCO2 arterial: 40.6 mmHg (ref 35.0–45.0)
pH, Arterial: 7.241 — ABNORMAL LOW (ref 7.350–7.450)
pO2, Arterial: 433 mmHg — ABNORMAL HIGH (ref 80.0–100.0)

## 2014-12-28 LAB — LEGIONELLA ANTIGEN, URINE

## 2014-12-28 LAB — HIV ANTIBODY (ROUTINE TESTING W REFLEX)
HIV 1/O/2 Abs-Index Value: <1 (ref <1.00)
HIV-1/HIV-2 Ab: NONREACTIVE

## 2014-12-28 LAB — URINE MICROSCOPIC-ADD ON

## 2014-12-28 LAB — GLUCOSE, CAPILLARY
Glucose-Capillary: 139 mg/dL — ABNORMAL HIGH (ref 70–99)
Glucose-Capillary: 94 mg/dL (ref 70–99)

## 2014-12-28 MED ORDER — MIDAZOLAM HCL 2 MG/2ML IJ SOLN
INTRAMUSCULAR | Status: AC
  Administered 2014-12-28: 2 mg
  Filled 2014-12-28: qty 2

## 2014-12-28 MED ORDER — LORAZEPAM 2 MG/ML IJ SOLN
INTRAMUSCULAR | Status: AC
  Administered 2014-12-28: 0.5 mg
  Filled 2014-12-28: qty 1

## 2014-12-28 MED ORDER — CHLORHEXIDINE GLUCONATE 0.12 % MT SOLN
15.0000 mL | Freq: Two times a day (BID) | OROMUCOSAL | Status: DC
  Administered 2014-12-28 – 2014-12-30 (×4): 15 mL via OROMUCOSAL
  Filled 2014-12-28 (×4): qty 15

## 2014-12-28 MED ORDER — LEVOTHYROXINE SODIUM 100 MCG IV SOLR
25.0000 ug | Freq: Every day | INTRAVENOUS | Status: DC
  Filled 2014-12-28: qty 5

## 2014-12-28 MED ORDER — ATENOLOL 50 MG PO TABS
50.0000 mg | ORAL_TABLET | Freq: Every evening | ORAL | Status: DC
  Filled 2014-12-28: qty 1

## 2014-12-28 MED ORDER — FENTANYL CITRATE 0.05 MG/ML IJ SOLN
INTRAMUSCULAR | Status: AC
  Administered 2014-12-28: 50 ug
  Filled 2014-12-28: qty 2

## 2014-12-28 MED ORDER — SIMVASTATIN 20 MG PO TABS
20.0000 mg | ORAL_TABLET | Freq: Every evening | ORAL | Status: DC
  Administered 2014-12-29 – 2015-01-06 (×6): 20 mg
  Filled 2014-12-28 (×10): qty 1

## 2014-12-28 MED ORDER — PIPERACILLIN-TAZOBACTAM IN DEX 2-0.25 GM/50ML IV SOLN
2.2500 g | Freq: Four times a day (QID) | INTRAVENOUS | Status: DC
  Administered 2014-12-29 – 2014-12-30 (×6): 2.25 g via INTRAVENOUS
  Filled 2014-12-28 (×10): qty 50

## 2014-12-28 MED ORDER — ETOMIDATE 2 MG/ML IV SOLN
0.3000 mg/kg | Freq: Once | INTRAVENOUS | Status: AC
  Administered 2014-12-28: 10 mg via INTRAVENOUS

## 2014-12-28 MED ORDER — LORAZEPAM 2 MG/ML IJ SOLN
0.2500 mg | Freq: Once | INTRAMUSCULAR | Status: AC
Start: 2014-12-28 — End: 2014-12-28
  Administered 2014-12-28: 0.25 mg via INTRAVENOUS
  Filled 2014-12-28: qty 1

## 2014-12-28 MED ORDER — FENTANYL CITRATE 0.05 MG/ML IJ SOLN
50.0000 ug | INTRAMUSCULAR | Status: DC | PRN
  Administered 2014-12-28 – 2014-12-30 (×4): 50 ug via INTRAVENOUS
  Filled 2014-12-28 (×5): qty 2

## 2014-12-28 MED ORDER — PROPOFOL 10 MG/ML IV EMUL
0.0000 ug/kg/min | INTRAVENOUS | Status: DC
  Administered 2014-12-28: 5 ug/kg/min via INTRAVENOUS
  Administered 2014-12-29 (×2): 30 ug/kg/min via INTRAVENOUS
  Administered 2014-12-29: 25 ug/kg/min via INTRAVENOUS
  Administered 2014-12-30 (×2): 40 ug/kg/min via INTRAVENOUS
  Filled 2014-12-28 (×7): qty 100

## 2014-12-28 MED ORDER — LEVOTHYROXINE SODIUM 50 MCG PO TABS
50.0000 ug | ORAL_TABLET | Freq: Every day | ORAL | Status: DC
  Administered 2014-12-29 – 2015-01-07 (×8): 50 ug
  Filled 2014-12-28 (×11): qty 1

## 2014-12-28 MED ORDER — FUROSEMIDE 10 MG/ML IJ SOLN
60.0000 mg | Freq: Once | INTRAMUSCULAR | Status: AC
  Administered 2014-12-28: 60 mg via INTRAVENOUS

## 2014-12-28 MED ORDER — ALBUTEROL SULFATE (2.5 MG/3ML) 0.083% IN NEBU: 2.5000 mg | INHALATION_SOLUTION | Freq: Four times a day (QID) | RESPIRATORY_TRACT | Status: DC | PRN

## 2014-12-28 MED ORDER — RESOURCE THICKENUP CLEAR PO POWD
ORAL | Status: DC | PRN
  Filled 2014-12-28: qty 125

## 2014-12-28 MED ORDER — ALBUTEROL SULFATE (2.5 MG/3ML) 0.083% IN NEBU: 2.5000 mg | INHALATION_SOLUTION | RESPIRATORY_TRACT | Status: DC | PRN

## 2014-12-28 MED ORDER — FUROSEMIDE 10 MG/ML IJ SOLN
INTRAMUSCULAR | Status: AC
  Filled 2014-12-28: qty 8

## 2014-12-28 MED ORDER — PANTOPRAZOLE SODIUM 40 MG PO PACK
40.0000 mg | PACK | Freq: Every day | ORAL | Status: DC
  Administered 2014-12-29: 40 mg
  Filled 2014-12-28 (×2): qty 20

## 2014-12-28 MED ORDER — CETYLPYRIDINIUM CHLORIDE 0.05 % MT LIQD
7.0000 mL | Freq: Four times a day (QID) | OROMUCOSAL | Status: DC
  Administered 2014-12-29 – 2014-12-30 (×7): 7 mL via OROMUCOSAL

## 2014-12-28 MED ORDER — INSULIN ASPART 100 UNIT/ML ~~LOC~~ SOLN: 0.0000 [IU] | SUBCUTANEOUS | Status: DC

## 2014-12-28 MED ORDER — GUAIFENESIN-DM 100-10 MG/5ML PO SYRP
5.0000 mL | ORAL_SOLUTION | Freq: Four times a day (QID) | ORAL | Status: DC | PRN
  Administered 2014-12-28: 5 mL via ORAL
  Filled 2014-12-28 (×2): qty 5

## 2014-12-28 MED ORDER — PANTOPRAZOLE SODIUM 40 MG IV SOLR
40.0000 mg | INTRAVENOUS | Status: DC
  Administered 2014-12-28: 40 mg via INTRAVENOUS
  Filled 2014-12-28: qty 40

## 2014-12-28 MED ORDER — ACETAMINOPHEN 160 MG/5ML PO SOLN
650.0000 mg | Freq: Four times a day (QID) | ORAL | Status: DC | PRN
  Filled 2014-12-28: qty 20.3

## 2014-12-28 MED ORDER — HALOPERIDOL LACTATE 5 MG/ML IJ SOLN
1.0000 mg | Freq: Once | INTRAMUSCULAR | Status: DC
  Filled 2014-12-28: qty 1

## 2014-12-28 NOTE — Significant Event (Signed)
Rapid Response Event Note  Overview:  Called to assist with patient with resp distress Time Called: 1822 Arrival Time: 1825 Event Type: Respiratory  Initial Focused Assessment: On arrival patient alert - able to speak - cough - skin hot and moist - flushed - per staff was eating and did fine with solids but  with drinking of nectar thick tomatoes juice started coughing and stating he could not breathe - RT present - NTS'd moderate amounts red thick secretions - patient able to cough secretions to back of throat but unable to bring forward - bil BS with wet rhonchi in all bases bilateral - audible wet resps - using accessory muscles - restless and agitated -- RR 36-O2 sat 97% on 3  liter nasal cannula - BP 170/74 HR paced 74 - denies pain just "can't breathe.'  Newly placed HD cath patent right s/c - per staff had HD today and pulled 2 liters off.     Interventions:  Repositioned with HOB up - continued to suction back of throat for moderate amounts red colored thick secretions.  Continued loud audible wet resps - wet voice with talking.  Stat PCXR ordered.  Resp here - again NTS'd thick secretions.  Breath sounds remain wet rhonchi - not clearing. Increased WOB and distress.   IV Lasix given per RN Antoinette at order of Dr. Isidoro Donningai.  Stat call to Dr. Isidoro Donningai - requesting stat transfer to ICU with PCCM consult -   Patient becoming more weak with less airway protection - placed on NRB mask - impending intubation.  Dr. Tyson AliasFeinstein on phone - Katharina Caperhaul Desi NP to room = transferred to [id number] with NRB mask - ambu bag and heart monitor.   Bp 162/84 HR 74 paced - RR 32 O2 sats 97% on NRB mask.  Handoff to [facility] RN - multiple 70M staff at bedside.    Event Summary: Name of Physician Notified: Dr. Isidoro Donningai at  (pta RRT)  Name of Consulting Physician Notified: Dr. Tyson AliasFeinstein at 1845  Outcome: Transferred (Comment) (52m09)  Event End Time: [id number]  Delton PrairieBritt, Jelene Albano L

## 2014-12-28 NOTE — Progress Notes (Signed)
Notified MD of pt's refusal of care. Pt is currently refusing all medications, vital signs,  CBGs and continence care. Pt presents with unsafe behavior, attempting to pull himself out of the bed onto the floor. Bed in lowest position, callbell within reach, bed alarm active.

## 2014-12-28 NOTE — Progress Notes (Signed)
Pt was being assisted to the bathroom by the tech and he slide, so tech said he assisted him to the ground. No injuries noted upon assessment, NO c/o of pain by pt. Pt assisted back to bed with a life. NP notified, no orders due to no injuries.Pt is non-compliant to instructions after fall. He is not steady on his feet. Will cont to monitor.

## 2014-12-28 NOTE — Procedures (Signed)
I have seen and examined this patient and agree with the plan of care . Patient was seen on dilaysis  Procedure   Placement of triple lumen dialysis catheter Seldinger technique using Ultrasound guide Place 20cm catheter No issues and minimal blood loss Lily Kernen W 12/28/2014, 1:19 PM

## 2014-12-28 NOTE — Progress Notes (Signed)
Pt transferred to 36M. Report called to  Marva Panda. Kelly RN

## 2014-12-28 NOTE — Progress Notes (Signed)
MD notified of change in patient status. Pt presents with increased work of breathing, VSS. Respiratory care and Rapid response bedside to evaluate. New orders received and implemented. Radiology called for stat CXR. Will continue to  Monitor closely.

## 2014-12-28 NOTE — Procedures (Signed)
Objective Swallowing Evaluation: Modified Barium Swallowing Study  Patient Details  Name: Edward [name] MRN: [id number] Date of Birth: 13-Feb-1946  Today's Date: 12/28/2014 Time: 1100-1130 SLP Time Calculation (min) (ACUTE ONLY): 30 min  Past Medical History:  Past Medical History  Diagnosis Date  . Hypertension   . Hypothyroidism   . Diabetes mellitus without complication    Past Surgical History:  Past Surgical History  Procedure Laterality Date  . Chest tube placement     HPI:  Pt is 69 y.o. male who was admitted with acute renal failure, anemia, pna; hx of CVA  (2007) with left hemiparesis.  He reports swallowing difficulty immediately post-stroke, but not in recent years.  Poor performance during clinical swallow eval with concerns for aspiration, so MBS recommended.        Assessment / Plan / Recommendation Clinical Impression  Dysphagia Diagnosis: Mild-mod pharyngeal phase dysphagia  Clinical impression: Pt presents with a mild-mod pharyngeal dysphagia marked by premature spillage of POs into pharynx, immediate aspiration of thin liquids (not consistently accompanied by a cough), and mild vallecular residue of solids post-swallow.  Pt with fluctuating alertness during study, requiring frequent prompts to remain awake.  Compensatory techniques were not effective due to mentation.  Recommend initiating a dysphagia 3 diet with nectar-thick liquids; meds crushed with puree.  SLP to follow for diet toleration and advancement.     Treatment Recommendation  Therapy as outlined in treatment plan below    Diet Recommendation Dysphagia 3 (Mechanical Soft);Nectar-thick liquid   Liquid Administration via: Cup Medication Administration: Crushed with puree Supervision: Full supervision/cueing for compensatory strategies Compensations: Slow rate;Small sips/bites (hold tray if coughing) Postural Changes and/or Swallow Maneuvers: Seated upright 90 degrees    Other  Recommendations  Oral Care Recommendations: Oral care BID   Follow Up Recommendations   (tba)    Frequency and Duration min 3x week  2 weeks        SLP Swallow Goals     General Date of Onset: 12/26/14     Type of Study: Modified Barium Swallowing Study Reason for Referral: Objectively evaluate swallowing function Previous Swallow Assessment: clinical swallow evaluation 12/28/14 Diet Prior to this Study: NPO Temperature Spikes Noted: No Respiratory Status: Room air History of Recent Intubation: No Behavior/Cognition: Lethargic Oral Cavity - Dentition: Adequate natural dentition Self-Feeding Abilities: Able to feed self Patient Positioning: Upright in chair Baseline Vocal Quality: Wet Volitional Cough: Weak;Congested Volitional Swallow: Able to elicit Anatomy:  (presence of likely osteophytes C4-5 - no radiologist present to confirm) Pharyngeal Secretions: Not observed secondary MBS    Reason for Referral Objectively evaluate swallowing function   Oral Phase Oral Preparation/Oral Phase Oral Phase: WFL   Pharyngeal Phase Pharyngeal Phase Pharyngeal Phase: Impaired Pharyngeal - Nectar Pharyngeal - Nectar Cup: Premature spillage to valleculae;Reduced tongue base retraction Pharyngeal - Thin Pharyngeal - Thin Cup: Premature spillage to pyriform sinuses;Reduced airway/laryngeal closure;Penetration/Aspiration during swallow;Penetration/Aspiration after swallow;Trace aspiration Penetration/Aspiration details (thin cup): Material enters airway, passes BELOW cords without attempt by patient to eject out (silent aspiration);Material enters airway, passes BELOW cords and not ejected out despite cough attempt by patient Pharyngeal - Thin Straw: Premature spillage to pyriform sinuses;Reduced airway/laryngeal closure;Penetration/Aspiration during swallow;Penetration/Aspiration after swallow;Trace aspiration Penetration/Aspiration details (thin straw): Material enters airway, passes BELOW cords and not  ejected out despite cough attempt by patient;Material enters airway, passes BELOW cords without attempt by patient to eject out (silent aspiration) Pharyngeal - Solids Pharyngeal - Puree: Premature spillage to valleculae;Reduced tongue base retraction;Pharyngeal residue -  valleculae Pharyngeal - Regular: Premature spillage to valleculae;Reduced tongue base retraction;Pharyngeal residue - valleculae     Querida Beretta L. [name], [place]MA CCC/SLP Pager [phone]               [name], Matalie Romberger Laurice 12/28/2014, 2:03 PM

## 2014-12-28 NOTE — Progress Notes (Signed)
Called for a rapid response call. Pt coughing profusely after eating & Rhonchus throughout. NTS'd large amount of thick orange & oral suctioned. Pt remained stable throughout with sp02 97%.Rapid response RN came in shortly after. Pt is combative.

## 2014-12-28 NOTE — Progress Notes (Signed)
[facility] ROUNDING NOTE   Subjective:   Interval History: poor historian  Although appears to have worsening dyspnea this morning  Objective:  Vital signs in last 24 hours:  Temp:  [97.6 F (36.4 C)-97.9 F (36.6 C)] 97.6 F (36.4 C) (01/13 1037) Pulse Rate:  [61-71] 61 (01/13 1037) Resp:  [18-20] 18 (01/13 1037) BP: (118-169)/(66-84) 118/68 mmHg (01/13 1037) SpO2:  [93 %-97 %] 97 % (01/13 1037) Weight:  [100.7 kg (222 lb 0.1 oz)] 100.7 kg (222 lb 0.1 oz) (01/12 2203)  Weight change: 0.4 kg (14.1 oz) Filed Weights   12/26/14 2000 12/27/14 2203  Weight: 100.3 kg (221 lb 1.9 oz) 100.7 kg (222 lb 0.1 oz)    Intake/Output: I/O last 3 completed shifts: In: 1235 [P.O.:820; I.V.:3; IV Piggyback:412] Out: 250 [Urine:250]   Intake/Output this shift:     CVS- RRR RS-   Diminished  ABD- BS present soft non-distended EXT-  3 + edema   Basic Metabolic Panel:  Recent Labs Lab 12/26/14 2041 12/27/14 0349 12/28/14 0535  NA 137 138 131*  K 5.2* 5.1 5.0  CL 111 112 107  CO2 11* 12* 11*  GLUCOSE 293* 197* 155*  BUN 91* 92* 107*  CREATININE 5.95* 5.93* 6.50*  CALCIUM 6.3* 6.3* 6.2*  PHOS 9.7* 9.7* 9.4*    Liver Function Tests:  Recent Labs Lab 12/26/14 2041 12/27/14 0349 12/28/14 0535  AST 14  --   --   ALT 14  --   --   ALKPHOS 45  --   --   BILITOT 0.5  --   --   PROT 5.5*  --   --   ALBUMIN 2.7* 2.4* 2.6*   No results for input(s): LIPASE, AMYLASE in the last 168 hours. No results for input(s): AMMONIA in the last 168 hours.  CBC:  Recent Labs Lab 12/26/14 2041 12/27/14 0349  WBC 13.7* 11.7*  NEUTROABS 12.9*  --   HGB 9.1* 8.4*  HCT 28.0* 25.6*  MCV 87.8 90.1  PLT 186 161    Cardiac Enzymes:  Recent Labs Lab 12/27/14 0349  CKTOTAL 620*    BNP: Invalid input(s): POCBNP  CBG:  Recent Labs Lab 12/26/14 2220 12/27/14 0807 12/27/14 1227 12/27/14 1805 12/28/14 1206  GLUCAP 220* 173* 166* 203* 139*     Microbiology: Results for orders placed or performed during the hospital encounter of 12/26/14  MRSA PCR Screening     Status: None   Collection Time: 12/26/14  7:57 PM  Result Value Ref Range Status   MRSA by PCR NEGATIVE NEGATIVE Final    Comment:        The GeneXpert MRSA Assay (FDA approved for NASAL specimens only), is one component of a comprehensive MRSA colonization surveillance program. It is not intended to diagnose MRSA infection nor to guide or monitor treatment for MRSA infections.   Culture, blood (routine x 2) Call MD if unable to obtain prior to antibiotics being given     Status: None (Preliminary result)   Collection Time: 12/27/14  5:30 AM  Result Value Ref Range Status   Specimen Description BLOOD RIGHT HAND  Final   Special Requests BOTTLES DRAWN AEROBIC ONLY 10CC  Final   Culture   Final           BLOOD CULTURE RECEIVED NO GROWTH TO DATE CULTURE WILL BE HELD FOR 5 DAYS BEFORE ISSUING A FINAL NEGATIVE REPORT Performed at [facility]    Report Status PENDING  Incomplete  Culture, blood (routine x 2) Call MD if unable to obtain prior to antibiotics being given     Status: None (Preliminary result)   Collection Time: 12/27/14  5:35 AM  Result Value Ref Range Status   Specimen Description BLOOD RIGHT ARM  Final   Special Requests BOTTLES DRAWN AEROBIC ONLY 10CC  Final   Culture   Final           BLOOD CULTURE RECEIVED NO GROWTH TO DATE CULTURE WILL BE HELD FOR 5 DAYS BEFORE ISSUING A FINAL NEGATIVE REPORT [facility]formed at [facility]    Report Status PENDING  Incomplete    Coagulation Studies: No results for input(s): LABPROT, INR in the last 72 hours.  Urinalysis:  Recent Labs  12/27/14 0849  COLORURINE YELLOW  LABSPEC 1.015  PHURINE 5.0  GLUCOSEU 250*  HGBUR MODERATE*  BILIRUBINUR NEGATIVE  KETONESUR NEGATIVE  PROTEINUR 100*  UROBILINOGEN 0.2  NITRITE NEGATIVE  LEUKOCYTESUR NEGATIVE      Imaging: Dg Chest Port 1  View  12/26/2014   CLINICAL DATA:  Fluid overload.  Shortness of breath.  EXAM: PORTABLE CHEST - 1 VIEW  COMPARISON:  Earlier the same day at 05:12 hr  FINDINGS: Portable AP view at 1956 hr: Low lung volumes persist. There is improving right basilar aeration with decreasing right basilar airspace disease. Unchanged left basilar airspace disease. Pulmonary vascular congestion, unchanged from prior. Cardiomediastinal contours are unchanged. Question small bilateral pleural effusions.  IMPRESSION: Improving right basilar aeration with decreasing airspace opacity. Question small bilateral pleural effusions.   Electronically Signed   By: Rubye OaksMelanie  Ehinger M.D.   On: 12/26/2014 20:37     Medications:     . antiseptic oral rinse  7 mL Mouth Rinse BID  . aspirin EC  325 mg Oral QPM  . atenolol  50 mg Oral QPM  . budesonide (PULMICORT) nebulizer solution  0.25 mg Nebulization BID  . calcium acetate  1,334 mg Oral TID WC  . furosemide  120 mg Intravenous Q8H  . heparin  5,000 Units Subcutaneous 3 times per day  . insulin aspart  0-9 Units Subcutaneous TID WC  . insulin glargine  10 Units Subcutaneous QHS  . levothyroxine  25 mcg Intravenous Daily  . multivitamin  1 tablet Oral QHS  . piperacillin-tazobactam (ZOSYN)  IV  2.25 g Intravenous 4 times per day  . simvastatin  20 mg Oral QPM  . sodium bicarbonate  650 mg Oral BID  . sodium chloride  3 mL Intravenous Q12H   acetaminophen **OR** acetaminophen, guaiFENesin-dextromethorphan, ondansetron **OR** ondansetron (ZOFRAN) IV  Assessment/ Plan:   ARF vs CKD This may be some progression of disease  Creatinine has increased  Last labs 18 months ago  Cr 1.4   Hyerkalemia  improved  Metabolic acidosis will continue to follow  Volume overload minimal urine output appears to be having some increase shortness of breath   Will proceed with dialysis    LOS: 2 Edward [name] W @TODAY @1 :09 PM

## 2014-12-28 NOTE — Evaluation (Signed)
Clinical/Bedside Swallow Evaluation Patient Details  Name: Edward [name] MRN: [id number] Date of Birth: [birth year]  Today's Date: 12/28/2014 Time: 0942-1008 SLP Time Calculation (min) (ACUTE ONLY): 26 min  Past Medical History:  Past Medical History  Diagnosis Date  . Hypertension   . Hypothyroidism   . Diabetes mellitus without complication    Past Surgical History:  Past Surgical History  Procedure Laterality Date  . Chest tube placement     HPI:  Pt is 69 y.o. male who was admitted with acute renal failure, anemia, pna; hx of CVA  (2007) with left hemiparesis.  He reports swallowing difficulty immediately post-stroke, but not in recent years.       Assessment / Plan / Recommendation Clinical Impression  Pt presents with concerns for an acute reversible dysphagia, potentially decompensation for a baseline mild dysphagia s/p remote CVA.   Presents with cough response after consumption of both thin and nectar-thick liquids; wet, audible, palpable congestion at level of larynx.  Lungs rhonchorous.  Given s/s of aspiration, recommend proceeding with MBS to determine degree of impairment and safest diet.      Aspiration Risk  Moderate    Diet Recommendation NPO        Other  Recommendations Recommended Consults: MBS                      SLP Swallow Goals   Pending results of MBS  Swallow Study Prior Functional Status       General Date of Onset: 12/26/14 HPI: Pt is 69 y.o. male who was admitted with acute renal failure, anemia, pna; hx of CVA  (2007) with left hemiparesis.  He reports swallowing difficulty immediately post-stroke, but not in recent years.     Type of Study: Bedside swallow evaluation Previous Swallow Assessment: none per records Diet Prior to this Study: NPO Temperature Spikes Noted: No Respiratory Status: Room air History of Recent Intubation: No Behavior/Cognition: Lethargic Oral Cavity - Dentition: Adequate natural dentition Self-Feeding  Abilities: Able to feed self Patient Positioning: Upright in bed Baseline Vocal Quality: Wet Volitional Cough: Weak;Congested Volitional Swallow: Able to elicit    Oral/Motor/Sensory Function Overall Oral Motor/Sensory Function:  (mild right facial asymmetry at rest)   Ice Chips Ice chips: Not tested   Thin Liquid Thin Liquid: Impaired Presentation: Cup;Straw Pharyngeal  Phase Impairments: Multiple swallows;Cough - Immediate    Nectar Thick Nectar Thick Liquid: Impaired Presentation: Cup;Self Fed Pharyngeal Phase Impairments: Multiple swallows;Cough - Delayed   Honey Thick Honey Thick Liquid: Not tested   Puree Puree: Within functional limits   solids Solid: Within functional limits      Edward [name] L. Edward Fredericouture, MA CCC/SLP Pager [phone]  [name], Edward [name] 12/28/2014,10:17 AM

## 2014-12-28 NOTE — Progress Notes (Signed)
[facility] PROGRESS NOTE  Edward [name] BJY:[id number] DOB: 1946/06/14 DOA: 12/26/2014   PCP: Kendell Bane, MD   Brief history of present illness  Patient is a 69 year old male with prior CVA with left-sided hemiparesis, type 2 diabetes, hypertension, hypothyroidism was taken to [facility] 2 days ago after patient was having increasing weakness with lower extremities edema. Patient was found to have elevated creatinine, low hemoglobin. Chest x-ray showed possible pneumonia. Patient also received 2 units of packed RBCs at [facility] for the anemia. His respiratory status however continued to worsen and was given Lasix and transferred to [facility] for further management. 2-D echo at [facility] showed EF 55-60% with moderate pulmonary hypertension and LVH. Renal ultrasound was normal. Hemoglobin on admission 7.6, creatinine 6.1 with bicarbonate of 13, ABG showed pH of 7.1. Patient's home medication listed that patient was on ACE inhibitor otherwise he was not aware of having kidney problems.   Assessment/Plan:  Acute renal failure: Probably progression of chronic kidney disease.  - Renal ultrasound did not show any obstruction. Nephrology consulted, currently on Lasix with no significant improvement, patient demonstrating fluid overload with bibasilar crackles - Discussed with Dr. Hyman Hopes, contemplating starting hemodialysis, may help with the volume overload - Continue bicarbonate, limit nephrotoxic agents.  Pneumonia with mild acute respiratory distress - We will change to IV Zosyn, currently bibasilar crackles with rhonchorous lungs, likely aspirating, - Nothing by mouth, obtain swallow evaluation - Placed on O2, scheduled bronchodilators  Metabolic acidosis - Likely due to progression of chronic kidney disease, continue bicarbonate per renal  Hypothyroidism - Changed to IV Synthroid  Diabetes mellitus uncontrolled - Hemoglobin A1c 6.2, continue sliding  scale insulin  History of CVA with left-sided hemiparesis - We will continue antiplatelet agents and statins, however nothing by mouth for now  Code Status: Full  Family Communication: no family at bedside  Disposition Plan:    Consultants:  Nephrology   Antibiotics  Rocephin discontinued 1/13  zithromax discontinued 1/13  IV Zosyn 1/13 >  Subjective  Patient seen and examined, having mild respiratory distress, lungs rhonchorous, no fevers or chills   Objective: BP 118/68 mmHg  Pulse 61  Temp(Src) 97.6 F (36.4 C) (Oral)  Resp 18  Ht  (1.727 m)  Wt 100.7 kg (222 lb 0.1 oz)  BMI 33.76 kg/m2  SpO2 97%   Intake/Output Summary (Last 24 hours) at 12/28/14 1043 Last data filed at 12/27/14 1900  Gross per 24 hour  Intake    390 ml  Output      0 ml  Net    390 ml   Filed Weights   12/26/14 2000 12/27/14 2203  Weight: 100.3 kg (221 lb 1.9 oz) 100.7 kg (222 lb 0.1 oz)    Exam:   General: Alert and oriented, mild respiratory distress  CVS: S1 and S2, no rubs or gallops  Chest: Diffuse rhonchi bilaterally  Abdomen: soft, NT, positive BS; no distension   Extremities: 2+ pitting edema  Data Reviewed: Basic Metabolic Panel:  Recent Labs Lab 12/26/14 2041 12/27/14 0349 12/28/14 0535  NA 137 138 131*  K 5.2* 5.1 5.0  CL 111 112 107  CO2 11* 12* 11*  GLUCOSE 293* 197* 155*  BUN 91* 92* 107*  CREATININE 5.95* 5.93* 6.50*  CALCIUM 6.3* 6.3* 6.2*  PHOS 9.7* 9.7* 9.4*   Liver Function Tests:  Recent Labs Lab 12/26/14 2041 12/27/14 0349 12/28/14 0535  AST 14  --   --   ALT 14  --   --  ALKPHOS 45  --   --   BILITOT 0.5  --   --   PROT 5.5*  --   --   ALBUMIN 2.7* 2.4* 2.6*   CBC:  Recent Labs Lab 12/26/14 2041 12/27/14 0349  WBC 13.7* 11.7*  NEUTROABS 12.9*  --   HGB 9.1* 8.4*  HCT 28.0* 25.6*  MCV 87.8 90.1  PLT 186 161   Cardiac Enzymes:  Recent Labs Lab 12/27/14 0349  CKTOTAL 620*   CBG:  Recent Labs Lab  12/26/14 2220 12/27/14 0807 12/27/14 1227 12/27/14 1805  GLUCAP 220* 173* 166* 203*    Recent Results (from the past 240 hour(s))  MRSA PCR Screening     Status: None   Collection Time: 12/26/14  7:57 PM  Result Value Ref Range Status   MRSA by PCR NEGATIVE NEGATIVE Final    Comment:        The GeneXpert MRSA Assay (FDA approved for NASAL specimens only), is one component of a comprehensive MRSA colonization surveillance program. It is not intended to diagnose MRSA infection nor to guide or monitor treatment for MRSA infections.   Culture, blood (routine x 2) Call MD if unable to obtain prior to antibiotics being given     Status: None (Preliminary result)   Collection Time: 12/27/14  5:30 AM  Result Value Ref Range Status   Specimen Description BLOOD RIGHT HAND  Final   Special Requests BOTTLES DRAWN AEROBIC ONLY 10CC  Final   Culture   Final           BLOOD CULTURE RECEIVED NO GROWTH TO DATE CULTURE WILL BE HELD FOR 5 DAYS BEFORE ISSUING A FINAL NEGATIVE REPORT Performed at [facility]    Report Status PENDING  Incomplete  Culture, blood (routine x 2) Call MD if unable to obtain prior to antibiotics being given     Status: None (Preliminary result)   Collection Time: 12/27/14  5:35 AM  Result Value Ref Range Status   Specimen Description BLOOD RIGHT ARM  Final   Special Requests BOTTLES DRAWN AEROBIC ONLY 10CC  Final   Culture   Final           BLOOD CULTURE RECEIVED NO GROWTH TO DATE CULTURE WILL BE HELD FOR 5 DAYS BEFORE ISSUING A FINAL NEGATIVE REPORT Performed at [facility]    Report Status PENDING  Incomplete     Studies: Dg Chest Port 1 View  12/26/2014   CLINICAL DATA:  Fluid overload.  Shortness of breath.  EXAM: PORTABLE CHEST - 1 VIEW  COMPARISON:  Earlier the same day at 05:12 hr  FINDINGS: Portable AP view at 1956 hr: Low lung volumes persist. There is improving right basilar aeration with decreasing right basilar airspace disease.  Unchanged left basilar airspace disease. Pulmonary vascular congestion, unchanged from prior. Cardiomediastinal contours are unchanged. Question small bilateral pleural effusions.  IMPRESSION: Improving right basilar aeration with decreasing airspace opacity. Question small bilateral pleural effusions.   Electronically Signed   By: Rubye OaksMelanie  Ehinger M.D.   On: 12/26/2014 20:37    Scheduled Meds: . antiseptic oral rinse  7 mL Mouth Rinse BID  . aspirin EC  325 mg Oral QPM  . atenolol  50 mg Oral QPM  . azithromycin  500 mg Intravenous Q24H  . budesonide (PULMICORT) nebulizer solution  0.25 mg Nebulization BID  . calcium acetate  1,334 mg Oral TID WC  . cefTRIAXone (ROCEPHIN)  IV  1 g Intravenous Q24H  .  furosemide  120 mg Intravenous Q8H  . heparin  5,000 Units Subcutaneous 3 times per day  . insulin aspart  0-9 Units Subcutaneous TID WC  . insulin glargine  10 Units Subcutaneous QHS  . levothyroxine  50 mcg Oral QAC breakfast  . multivitamin  1 tablet Oral QHS  . simvastatin  20 mg Oral QPM  . sodium bicarbonate  650 mg Oral BID  . sodium chloride  3 mL Intravenous Q12H   Time spent: 25 minutes  Sudie Bandel M.D. [facility] 12/28/2014, 10:53 AM  Pager: [phone]

## 2014-12-28 NOTE — Progress Notes (Signed)
ANTIBIOTIC CONSULT NOTE - INITIAL  Pharmacy Consult for zosyn Indication: pneumonia  No Known Allergies  Patient Measurements: Height:  (172.7 cm) Weight: 222 lb 0.1 oz (100.7 kg) IBW/kg (Calculated) : 68.4  Vital Signs: Temp: 97.6 F (36.4 C) (01/13 1037) Temp Source: Oral (01/13 1037) BP: 118/68 mmHg (01/13 1037) Pulse Rate: 61 (01/13 1037) Intake/Output from previous day: 01/12 0701 - 01/13 0700 In: 633 [P.O.:580; I.V.:3; IV Piggyback:50] Out: 150 [Urine:150] Intake/Output from this shift:    Labs:  Recent Labs  12/26/14 2041 12/27/14 0349 12/27/14 0849 12/28/14 0535  WBC 13.7* 11.7*  --   --   HGB 9.1* 8.4*  --   --   PLT 186 161  --   --   LABCREA  --   --  64.08  63.24  --   CREATININE 5.95* 5.93*  --  6.50*   Estimated Creatinine Clearance: 12.5 mL/min (by C-G formula based on Cr of 6.5). No results for input(s): VANCOTROUGH, VANCOPEAK, VANCORANDOM, GENTTROUGH, GENTPEAK, GENTRANDOM, TOBRATROUGH, TOBRAPEAK, TOBRARND, AMIKACINPEAK, AMIKACINTROU, AMIKACIN in the last 72 hours.   Microbiology: Recent Results (from the past 720 hour(s))  MRSA PCR Screening     Status: None   Collection Time: 12/26/14  7:57 PM  Result Value Ref Range Status   MRSA by PCR NEGATIVE NEGATIVE Final    Comment:        The GeneXpert MRSA Assay (FDA approved for NASAL specimens only), is one component of a comprehensive MRSA colonization surveillance program. It is not intended to diagnose MRSA infection nor to guide or monitor treatment for MRSA infections.   Culture, blood (routine x 2) Call MD if unable to obtain prior to antibiotics being given     Status: None (Preliminary result)   Collection Time: 12/27/14  5:30 AM  Result Value Ref Range Status   Specimen Description BLOOD RIGHT HAND  Final   Special Requests BOTTLES DRAWN AEROBIC ONLY 10CC  Final   Culture   Final           BLOOD CULTURE RECEIVED NO GROWTH TO DATE CULTURE WILL BE HELD FOR 5 DAYS BEFORE ISSUING  A FINAL NEGATIVE REPORT Performed at [facility]    Report Status PENDING  Incomplete  Culture, blood (routine x 2) Call MD if unable to obtain prior to antibiotics being given     Status: None (Preliminary result)   Collection Time: 12/27/14  5:35 AM  Result Value Ref Range Status   Specimen Description BLOOD RIGHT ARM  Final   Special Requests BOTTLES DRAWN AEROBIC ONLY 10CC  Final   Culture   Final           BLOOD CULTURE RECEIVED NO GROWTH TO DATE CULTURE WILL BE HELD FOR 5 DAYS BEFORE ISSUING A FINAL NEGATIVE REPORT Performed at [facility]    Report Status PENDING  Incomplete    Medical History: Past Medical History  Diagnosis Date  . Hypertension   . Hypothyroidism   . Diabetes mellitus without complication     Assessment: 40 yom admitted with acute renal failure on probable CKD being broadened from azithro/CTX to zosyn for pneumonia. Pt has mild acute respiratory distress and probable aspiration, per MD. Afeb, wbc slightly elevated 11.7, LA 1.3. Renal function continues to worsen, SCr increased 5.93>>6.5, CrCl~12. Nephrology considering HD for volume overload.  Goal of Therapy:  Eradication of infection  Plan:  -zosyn 2.25g IV q6h (borderline CrCl, f/u CrCl in AM and adjust as necessary) -  Monitor potential IHD needs, renal recovery -F/u clinical progress, abx plan, duration of therapy  Babs BertinHaley Derk Doubek, PharmD Clinical Pharmacist - Resident Pager [phone] 12/28/2014 11:49 AM

## 2014-12-28 NOTE — Consult Note (Signed)
PULMONARY / CRITICAL CARE MEDICINE   Name: Edward [name] MRN: [id number] DOB: 1946/07/11    ADMISSION DATE:  12/26/2014 CONSULTATION DATE:  12/28/14  REFERRING MD :  Eduard Clos  CHIEF COMPLAINT:  Acute respiratory failure  INITIAL PRESENTATION: Edward [name] is a 69yo man w/ PMHx of CVA with left-sided hemiparesis, Type 2 DM, CKD Stage 4/5 and HTN who came from [facility] after developing worsening AKI on CKD and respiratory distress. Patient then with minimal UOP and worsening AKI. HD was started 1/13 and ~2 L taken off. Patient then developed worsening respiratory distress with aspiration after eating. PCCM was consulted.   STUDIES:  CXR 1/11>> improving R basilar aeration with decreasing airspace opacity CXR 1/13>> Mild vascular congestion  SIGNIFICANT EVENTS: 1/9>> Pt admitted at [facility] for weakness, peripheral edema 1/11>> Transferred to [facility] for worsening AKI on CKD, respiratory distress 1/13>> First HD session, ~2 L taken off 1/13>> Developed worsening respiratory distress, aspiration. PCCM consulted    HISTORY OF PRESENT ILLNESS:  [name] is a 69yo man w/ PMHx of CVA with left-sided hemiparesis, Type 2 DM, CKD Stage 4/5 and HTN who initially presented to [facility] on 11/9 with increasing weakness and lower extremity edema. He then developed worsening respiratory distress and renal function so was transferred to [facility]. His Cr was found to be 6.1 and was started on Lasix 120 mg IV TID. He continued to have minimal urine output, worsening acute renal failure, and increased SOB. Patient was found to have a PNA on CXR and was started on IV Zosyn. A swallow evaluation was performed because of concern for aspiration, which the patient passed. Dialysis was started today (1/13), ~ 2L taken off. Patient tolerated procedure well. Later this evening he was eating dinner and began coughing profusely. Rapid response was called. He continued to have increased work of  breathing. PCCM was consulted.   PAST MEDICAL HISTORY :   has a past medical history of Hypertension; Hypothyroidism; and Diabetes mellitus without complication.  has past surgical history that includes chest tube placement. Prior to Admission medications   Medication Sig Start Date End Date Taking? Authorizing Provider  allopurinol (ZYLOPRIM) 300 MG tablet Take 300 mg by mouth every evening.   Yes Historical Provider, MD  aspirin EC 325 MG tablet Take 325 mg by mouth every evening.   Yes Historical Provider, MD  atenolol (TENORMIN) 50 MG tablet Take 50 mg by mouth every evening.   Yes Historical Provider, MD  benazepril (LOTENSIN) 40 MG tablet Take 40 mg by mouth every evening.   Yes Historical Provider, MD  furosemide (LASIX) 40 MG tablet Take 40 mg by mouth every evening.   Yes Historical Provider, MD  insulin aspart (NOVOLOG FLEXPEN) 100 UNIT/ML FlexPen Inject 2-10 Units into the skin daily with supper. Per sliding scale   Yes Historical Provider, MD  Insulin Glargine (LANTUS SOLOSTAR) 100 UNIT/ML Solostar Pen Inject 18 Units into the skin at bedtime. If blood sugar is >150   Yes Historical Provider, MD  Lactobacillus (ACIDOPHILUS PO) Take 1 tablet by mouth 2 (two) times daily.   Yes Historical Provider, MD  levothyroxine (SYNTHROID, LEVOTHROID) 50 MCG tablet Take 50 mcg by mouth every evening.   Yes Historical Provider, MD  OVER THE COUNTER MEDICATION Take 2 tablets by mouth every evening. Supplement to keep from having heart attack or stroke   Yes Historical Provider, MD  simvastatin (ZOCOR) 20 MG tablet Take 20 mg by mouth every evening.  Yes Historical Provider, MD   No Known Allergies  FAMILY HISTORY:  has no family status information on file.  SOCIAL HISTORY:  reports that he has never smoked. He does not have any smokeless tobacco history on file. He reports that he drinks alcohol.  REVIEW OF SYSTEMS: Unable to obtain   SUBJECTIVE: In moderate respiratory distress.   VITAL  SIGNS: Temp:  [97.6 F (36.4 C)-97.9 F (36.6 C)] 97.7 F (36.5 C) (01/13 1553) Pulse Rate:  [58-76] 76 (01/13 1851) Resp:  [14-22] 18 (01/13 1851) BP: (95-174)/(44-96) 162/84 mmHg (01/13 1851) SpO2:  [93 %-98 %] 97 % (01/13 1851) Weight:  [98.4 kg (216 lb 14.9 oz)-100.9 kg (222 lb 7.1 oz)] 98.4 kg (216 lb 14.9 oz) (01/13 1553) HEMODYNAMICS:   VENTILATOR SETTINGS:   INTAKE / OUTPUT:  Intake/Output Summary (Last 24 hours) at 12/28/14 1940 Last data filed at 12/28/14 1553  Gross per 24 hour  Intake      0 ml  Output   2500 ml  Net  -2500 ml    PHYSICAL EXAMINATION: General: awake, in moderate resp distress Neuro:  Awake, alert, residual left-sided weakness HEENT: [place]/AT, EOMI, PERRL. Food present in oral cavity, pharynx, seen on glidescope  Cardiovascular: RRR, no m/g/r Lungs: CTA bilaterally, no wheezes Abdomen: BS+, obese, soft, non-tender Musculoskeletal: 2+ pitting edema in lower extremities Skin:  Warm, well perfused  LABS:  CBC  Recent Labs Lab 12/26/14 2041 12/27/14 0349  WBC 13.7* 11.7*  HGB 9.1* 8.4*  HCT 28.0* 25.6*  PLT 186 161   Coag's No results for input(s): APTT, INR in the last 168 hours. BMET  Recent Labs Lab 12/26/14 2041 12/27/14 0349 12/28/14 0535  NA 137 138 131*  K 5.2* 5.1 5.0  CL 111 112 107  CO2 11* 12* 11*  BUN 91* 92* 107*  CREATININE 5.95* 5.93* 6.50*  GLUCOSE 293* 197* 155*   Electrolytes  Recent Labs Lab 12/26/14 2041 12/27/14 0349 12/28/14 0535  CALCIUM 6.3* 6.3* 6.2*  PHOS 9.7* 9.7* 9.4*   Sepsis Markers  Recent Labs Lab 12/26/14 2158  LATICACIDVEN 1.3   ABG  Recent Labs Lab 12/26/14 2106  PHART 7.206*  PCO2ART 30.9*  PO2ART 130.0*   Liver Enzymes  Recent Labs Lab 12/26/14 2041 12/27/14 0349 12/28/14 0535  AST 14  --   --   ALT 14  --   --   ALKPHOS 45  --   --   BILITOT 0.5  --   --   ALBUMIN 2.7* 2.4* 2.6*   Cardiac Enzymes No results for input(s): TROPONINI, PROBNP in the last 168  hours. Glucose  Recent Labs Lab 12/26/14 2220 12/27/14 0807 12/27/14 1227 12/27/14 1805 12/28/14 1206  GLUCAP 220* 173* 166* 203* 139*    Imaging No results found.   ASSESSMENT / PLAN:  PULMONARY OETT 1/13>> A: Acute respiratory failure likely due to volume overload from ARF and aspiration  ?PNA- currently on Zosyn P:   Sedated on vent, adjust settings as necessary Likely can be extubated tommorrow AM Repeat CXR in AM Continue Zosyn  CARDIOVASCULAR A: ?CHF with pulm HTN- [facility]. EF 55-60%. No records in EPIC.  Hx HTN  P:  Continue Atenolol 50 mg daily Continue ASA Continue statin  RENAL A:  ARF vs. CKD- likely acute on chronic. Started HD 1/13 Hyponatremia 2/2 fluid overload P:   Renal following, appreciate recommendations Continue HD per Renal Consider stopping Lasix bmet in AM  GASTROINTESTINAL A:  No acute issues P:   Continue Protonix  HEMATOLOGIC A:  Anemia, likely due to chronic disease. Received 2 units PRBCs at [facility].  P:  CBC in AM  INFECTIOUS A:  ?PNA- started on antibiotics on 1/11, no consolidation on CXR. Risk for aspiration PNA.  P:   BCx2 1/12>> Sputum 1/13>>  Azithromycin 1/11>>1/13 Rocephin 1/11>>1/13 Zosyn 1/13>>  ENDOCRINE A:  Type 2 DM Hypothyroidism P:   Sensitive ISS CBGs 4 times daily Continue Synthroid   NEUROLOGIC A: Sedated on vent Hx CVA with residual left-sided hemiparesis ?Acute metabolic encephalopathy 2/2 ARF P:   RASS goal: 0 Sedation with fentanyl and propofol Continue to monitor mental status   FAMILY  - Updates: no family to update  - Inter-disciplinary family meet or Palliative Care meeting due by: 01/04/15  Rich Number, MD Internal Medicine Resident, PGY-1  Pulmonary and Critical Care Medicine [facility] Pager: [phone]  12/28/2014, 7:40 PM  Reviewed above, examined.  69 yo male present to OSH with weakness and swelling.  He has hx of CVA with Lt  sided weakness.  He was noted to have renal failure.  He was transferred to [facility].  He had dyspnea, and was started on abx for PNA.  He was started on HD earlier today.  He developed respiratory distress/failure after aspirating his dinner.  He was confused, with increased WOB.  He had diffuse rales on exam.  He required intubation >> at time of laryngoscopy he was noted to have food content in his posterior pharynx and upper trachea >> this was briskly suctioned.  Post-intubation CXR did not show significant lung collapse.  Will continue full vent support and f/u CXR and ABG.  Continue current Abx.  Defer bronchoscopy for now.  Defer timing of next HD to renal.  CC time by me independent of resident time or procedure time is 40 minutes.  Coralyn Helling, MD [facility] Pulmonary/Critical Care 12/28/2014, 11:07 PM Pager:  [phone] After 3pm call: [phone]

## 2014-12-28 NOTE — Procedures (Signed)
Intubation Procedure Note Edward SonsKenneth L [name] [id number] [birth year]  Procedure: Intubation Indications: Respiratory insufficiency  Procedure Details Consent: Unable to obtain consent because of altered level of consciousness. Time Out: Verified patient identification, verified procedure, site/side was marked, verified correct patient position, special equipment/implants available, medications/allergies/relevent history reviewed, required imaging and test results available.  Performed  Drugs 2mg  Versed, 50mcg Fentanyl, 10mg  Etomidate. Indirect laryngoscopy using Glidescope with # 3 MAC blade. 8.0 tube passed through cords under direct visualization.  Evaluation Hemodynamic Status: BP stable throughout; O2 sats: stable throughout Patient's Current Condition: stable Complications: No apparent complications Patient did tolerate procedure well. Chest X-ray ordered to verify placement.  CXR: pending.  Rutherford Guysahul Desai, [place] - C [place] [facility] Medicine Pgr: [phone]  or [phone] 12/28/2014, 7:39 PM  I was present for procedure.  Coralyn HellingVineet Coltin Casher, MD [facility] Pulmonary/Critical Care 12/28/2014, 8:36 PM Pager:  [phone] After 3pm call: [phone]

## 2014-12-29 ENCOUNTER — Inpatient Hospital Stay (HOSPITAL_COMMUNITY): Payer: Medicare Other

## 2014-12-29 DIAGNOSIS — N179 Acute kidney failure, unspecified: Principal | ICD-10-CM

## 2014-12-29 DIAGNOSIS — R400 Somnolence: Secondary | ICD-10-CM

## 2014-12-29 DIAGNOSIS — E1165 Type 2 diabetes mellitus with hyperglycemia: Secondary | ICD-10-CM

## 2014-12-29 DIAGNOSIS — J9601 Acute respiratory failure with hypoxia: Secondary | ICD-10-CM

## 2014-12-29 LAB — PROTEIN ELECTROPHORESIS, SERUM
Albumin ELP: 54.3 % — ABNORMAL LOW (ref 55.8–66.1)
Alpha-1-Globulin: 7.6 % — ABNORMAL HIGH (ref 2.9–4.9)
Alpha-2-Globulin: 17.6 % — ABNORMAL HIGH (ref 7.1–11.8)
BETA 2: 4.5 % (ref 3.2–6.5)
Beta Globulin: 6.3 % (ref 4.7–7.2)
Gamma Globulin: 9.7 % — ABNORMAL LOW (ref 11.1–18.8)
M-SPIKE, %: NOT DETECTED g/dL
Total Protein ELP: 5 g/dL — ABNORMAL LOW (ref 6.0–8.3)

## 2014-12-29 LAB — BASIC METABOLIC PANEL
ANION GAP: 12 (ref 5–15)
Anion gap: 11 (ref 5–15)
BUN: 85 mg/dL — AB (ref 6–23)
BUN: 85 mg/dL — AB (ref 6–23)
CALCIUM: 6.6 mg/dL — AB (ref 8.4–10.5)
CHLORIDE: 108 meq/L (ref 96–112)
CO2: 17 mmol/L — ABNORMAL LOW (ref 19–32)
CO2: 18 mmol/L — ABNORMAL LOW (ref 19–32)
CREATININE: 5.55 mg/dL — AB (ref 0.50–1.35)
Calcium: 6.6 mg/dL — ABNORMAL LOW (ref 8.4–10.5)
Chloride: 111 mEq/L (ref 96–112)
Creatinine, Ser: 5.52 mg/dL — ABNORMAL HIGH (ref 0.50–1.35)
GFR calc Af Amer: 11 mL/min — ABNORMAL LOW (ref >90)
GFR calc non Af Amer: 9 mL/min — ABNORMAL LOW (ref >90)
GFR calc non Af Amer: 10 mL/min — ABNORMAL LOW (ref >90)
GFR, EST AFRICAN AMERICAN: 11 mL/min — AB (ref >90)
Glucose, Bld: 109 mg/dL — ABNORMAL HIGH (ref 70–99)
Glucose, Bld: 109 mg/dL — ABNORMAL HIGH (ref 70–99)
POTASSIUM: 4.5 mmol/L (ref 3.5–5.1)
Potassium: 4.5 mmol/L (ref 3.5–5.1)
Sodium: 139 mmol/L (ref 135–145)
Sodium: 138 mmol/L (ref 135–145)

## 2014-12-29 LAB — CBC
HCT: 23.9 % — ABNORMAL LOW (ref 39.0–52.0)
HEMOGLOBIN: 8.1 g/dL — AB (ref 13.0–17.0)
MCH: 30.8 pg (ref 26.0–34.0)
MCHC: 33.9 g/dL (ref 30.0–36.0)
MCV: 90.9 fL (ref 78.0–100.0)
PLATELETS: 129 10*3/uL — AB (ref 150–400)
RBC: 2.63 MIL/uL — ABNORMAL LOW (ref 4.22–5.81)
RDW: 16 % — ABNORMAL HIGH (ref 11.5–15.5)
WBC: 9.9 10*3/uL (ref 4.0–10.5)

## 2014-12-29 LAB — GLUCOSE, CAPILLARY
GLUCOSE-CAPILLARY: 100 mg/dL — AB (ref 70–99)
GLUCOSE-CAPILLARY: 103 mg/dL — AB (ref 70–99)
GLUCOSE-CAPILLARY: 106 mg/dL — AB (ref 70–99)
GLUCOSE-CAPILLARY: 89 mg/dL (ref 70–99)
GLUCOSE-CAPILLARY: 99 mg/dL (ref 70–99)
Glucose-Capillary: 95 mg/dL (ref 70–99)

## 2014-12-29 LAB — HEPATITIS B CORE ANTIBODY, TOTAL: Hep B Core Total Ab: NONREACTIVE

## 2014-12-29 LAB — FOLATE RBC
FOLATE, RBC: 1972 ng/mL (ref >498)
Folate, Hemolysate: 489 ng/mL
Hematocrit: 24.8 % — ABNORMAL LOW (ref 37.5–51.0)

## 2014-12-29 LAB — TRIGLYCERIDES: Triglycerides: 197 mg/dL — ABNORMAL HIGH (ref <150)

## 2014-12-29 LAB — HEPATITIS B SURFACE ANTIGEN: Hepatitis B Surface Ag: NEGATIVE

## 2014-12-29 LAB — HEPATITIS B SURFACE ANTIBODY,QUALITATIVE: Hep B S Ab: NONREACTIVE — AB

## 2014-12-29 MED ORDER — PRO-STAT SUGAR FREE PO LIQD
30.0000 mL | Freq: Four times a day (QID) | ORAL | Status: DC
  Administered 2014-12-29 – 2015-01-02 (×7): 30 mL
  Filled 2014-12-29 (×20): qty 30

## 2014-12-29 MED ORDER — VITAL HIGH PROTEIN PO LIQD
1000.0000 mL | ORAL | Status: DC
  Filled 2014-12-29 (×2): qty 1000

## 2014-12-29 MED ORDER — NEPRO/CARBSTEADY PO LIQD
1000.0000 mL | ORAL | Status: DC
  Administered 2014-12-29: 1000 mL via ORAL
  Filled 2014-12-29 (×3): qty 1000

## 2014-12-29 NOTE — Progress Notes (Signed)
[facility] ROUNDING NOTE   Subjective:   Interval History: intubated due to aspiration syndrome  Objective:  Vital signs in last 24 hours:  Temp:  [97.3 F (36.3 C)-98 F (36.7 C)] 97.3 F (36.3 C) (01/14 0700) Pulse Rate:  [49-76] 51 (01/14 0900) Resp:  [11-22] 14 (01/14 0800) BP: (77-174)/(42-96) 122/54 mmHg (01/14 0800) SpO2:  [94 %-100 %] 100 % (01/14 0900) FiO2 (%):  [40 %-100 %] 40 % (01/14 0924) Weight:  [97.2 kg (214 lb 4.6 oz)-100.9 kg (222 lb 7.1 oz)] 97.2 kg (214 lb 4.6 oz) (01/14 0500)  Weight change: 0.2 kg (7.1 oz) Filed Weights   12/28/14 1348 12/28/14 1553 12/29/14 0500  Weight: 100.9 kg (222 lb 7.1 oz) 98.4 kg (216 lb 14.9 oz) 97.2 kg (214 lb 4.6 oz)    Intake/Output: I/O last 3 completed shifts: In: 438.4 [I.V.:154.4; NG/GT:60; IV Piggyback:224] Out: 4005 [Urine:1505; Other:2500]   Intake/Output this shift:  Total I/O In: 17.7 [I.V.:17.7] Out: 100 [Urine:100] Sedated CVS- RRR RS- CTA ET tube ABD- BS hypoactive EXT- no edema   Basic Metabolic Panel:  Recent Labs Lab 12/26/14 2041 12/27/14 0349 12/28/14 0535 12/29/14 0410  NA 137 138 131* 139  K 5.2* 5.1 5.0 4.5  CL 111 112 107 111  CO2 11* 12* 11* 17*  GLUCOSE 293* 197* 155* 109*  BUN 91* 92* 107* 85*  CREATININE 5.95* 5.93* 6.50* 5.55*  CALCIUM 6.3* 6.3* 6.2* 6.6*  PHOS 9.7* 9.7* 9.4*  --     Liver Function Tests:  Recent Labs Lab 12/26/14 2041 12/27/14 0349 12/28/14 0535  AST 14  --   --   ALT 14  --   --   ALKPHOS 45  --   --   BILITOT 0.5  --   --   PROT 5.5*  --   --   ALBUMIN 2.7* 2.4* 2.6*   No results for input(s): LIPASE, AMYLASE in the last 168 hours. No results for input(s): AMMONIA in the last 168 hours.  CBC:  Recent Labs Lab 12/26/14 2041 12/27/14 0349 12/29/14 0410  WBC 13.7* 11.7* 9.9  NEUTROABS 12.9*  --   --   HGB 9.1* 8.4* 8.1*  HCT 28.0* 25.6* 23.9*  MCV 87.8 90.1 90.9  PLT 186 161 129*    Cardiac Enzymes:  Recent  Labs Lab 12/27/14 0349  CKTOTAL 620*    BNP: Invalid input(s): POCBNP  CBG:  Recent Labs Lab 12/28/14 1206 12/28/14 1957 12/29/14 0032 12/29/14 0434 12/29/14 0739  GLUCAP 139* 94 103* 106* 99    Microbiology: Results for orders placed or performed during the hospital encounter of 12/26/14  MRSA PCR Screening     Status: None   Collection Time: 12/26/14  7:57 PM  Result Value Ref Range Status   MRSA by PCR NEGATIVE NEGATIVE Final    Comment:        The GeneXpert MRSA Assay (FDA approved for NASAL specimens only), is one component of a comprehensive MRSA colonization surveillance program. It is not intended to diagnose MRSA infection nor to guide or monitor treatment for MRSA infections.   Culture, blood (routine x 2) Call MD if unable to obtain prior to antibiotics being given     Status: None (Preliminary result)   Collection Time: 12/27/14  5:30 AM  Result Value Ref Range Status   Specimen Description BLOOD RIGHT HAND  Final   Special Requests BOTTLES DRAWN AEROBIC ONLY 10CC  Final   Culture   Final  BLOOD CULTURE RECEIVED NO GROWTH TO DATE CULTURE WILL BE HELD FOR 5 DAYS BEFORE ISSUING A FINAL NEGATIVE REPORT Performed at [facility]    Report Status PENDING  Incomplete  Culture, blood (routine x 2) Call MD if unable to obtain prior to antibiotics being given     Status: None (Preliminary result)   Collection Time: 12/27/14  5:35 AM  Result Value Ref Range Status   Specimen Description BLOOD RIGHT ARM  Final   Special Requests BOTTLES DRAWN AEROBIC ONLY 10CC  Final   Culture   Final           BLOOD CULTURE RECEIVED NO GROWTH TO DATE CULTURE WILL BE HELD FOR 5 DAYS BEFORE ISSUING A FINAL NEGATIVE REPORT Performed at [facility]    Report Status PENDING  Incomplete  Culture, respiratory (NON-Expectorated)     Status: None (Preliminary result)   Collection Time: 12/28/14  8:11 PM  Result Value Ref Range Status   Specimen  Description TRACHEAL ASPIRATE  Final   Special Requests NONE  Final   Gram Stain PENDING  Incomplete   Culture   Final    NO GROWTH 1 DAY Performed at [facility]    Report Status PENDING  Incomplete    Coagulation Studies: No results for input(s): LABPROT, INR in the last 72 hours.  Urinalysis:  Recent Labs  12/27/14 0849 12/28/14 1948  COLORURINE YELLOW YELLOW  LABSPEC 1.015 1.013  PHURINE 5.0 5.0  GLUCOSEU 250* 100*  HGBUR MODERATE* SMALL*  BILIRUBINUR NEGATIVE NEGATIVE  KETONESUR NEGATIVE NEGATIVE  PROTEINUR 100* >300*  UROBILINOGEN 0.2 0.2  NITRITE NEGATIVE NEGATIVE  LEUKOCYTESUR NEGATIVE NEGATIVE      Imaging: Portable Chest Xray  12/29/2014   CLINICAL DATA:  Respiratory failure.  EXAM: PORTABLE CHEST - 1 VIEW  COMPARISON:  12/28/2014.  FINDINGS: Interim placement of NGT. Its tip is below the left hemidiaphragm. Endotracheal tube and right IJ line in stable position. Low lung volumes with bibasilar atelectasis and/or infiltrates. No pleural effusion or pneumothorax. Stable cardiomegaly. Normal pulmonary vascularity.  IMPRESSION: 1. Interval placement NG tube, its tip is below the left hemidiaphragm. Endotracheal tube and right IJ line in stable position. 2. Low lung volumes with bibasilar atelectasis and/or infiltrates.   Electronically Signed   By: Maisie Fushomas  Register   On: 12/29/2014 07:29   Dg Chest Port 1 View  12/28/2014   CLINICAL DATA:  Intubation, respiratory failure  EXAM: PORTABLE CHEST - 1 VIEW  COMPARISON:  12/28/2014  FINDINGS: Endotracheal tube 5 cm above the carina. Right IJ dialysis catheter tip SVC RA junction level. Low lung volumes noted with mild vascular congestion and basilar atelectasis. Normal heart size. No effusion or pneumothorax. Stable exam.  IMPRESSION: Endotracheal tube 5 cm above the carina.  Stable low lung volumes and vascular congestion   Electronically Signed   By: Ruel Favorsrevor  Shick M.D.   On: 12/28/2014 19:58   Dg Chest Port 1  View  12/28/2014   CLINICAL DATA:  Central line placement.  EXAM: PORTABLE CHEST - 1 VIEW  COMPARISON:  Single view of the chest 12/26/2014.  FINDINGS: Right IJ catheter is in place with the tip just within the right atrium. The tube could be withdrawn 2 cm for positioning at the superior cavoatrial junction. Lung volumes are low with right basilar airspace disease again seen. Heart size is normal. There is no pneumothorax. Right pleural effusion is noted.  IMPRESSION: Right IJ catheter tip projects within the right atrium.  The line could be withdrawn 2 cm for positioning of the superior cavoatrial junction. Negative for pneumothorax.  No change in right effusion and basilar airspace disease.   Electronically Signed   By: Drusilla Kanner M.D.   On: 12/28/2014 13:41   Dg Abd Portable 1v  12/29/2014   CLINICAL DATA:  Orogastric tube placement.  Initial encounter.  EXAM: PORTABLE ABDOMEN - 1 VIEW  COMPARISON:  None.  FINDINGS: The patient's enteric tube is noted coiling at the body of the stomach and ending overlying the fundus of the stomach.  The visualized bowel gas pattern is grossly unremarkable. Contrast is seen filling the cecum, ascending colon and base of the appendix. Mild right basilar airspace opacity may reflect atelectasis. No acute osseous abnormalities are seen.  IMPRESSION: 1. Enteric tube noted coiling at the body of the stomach and ending overlying the fundus of the stomach. 2. Mild right basilar airspace opacity may reflect atelectasis.   Electronically Signed   By: Roanna Raider M.D.   On: 12/29/2014 02:38   Dg Swallowing Func-speech Pathology  12/28/2014   Carolan Shiver, CCC-SLP     12/28/2014  2:04 PM Objective Swallowing Evaluation: Modified Barium Swallowing Study   Patient Details  Name: Edward [name] MRN: [id number] Date of Birth: 03/16/1946  Today's Date: 12/28/2014 Time: 1100-1130 SLP Time Calculation (min) (ACUTE ONLY): 30 min  Past Medical History:  Past Medical History   Diagnosis Date  . Hypertension   . Hypothyroidism   . Diabetes mellitus without complication    Past Surgical History:  Past Surgical History  Procedure Laterality Date  . Chest tube placement     HPI:  Pt is 69 y.o. 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  He reports  swallowing difficulty immediately post-stroke, but not in recent  years.  Poor performance during clinical swallow eval with  concerns for aspiration, so MBS recommended.        Assessment / Plan / Recommendation Clinical Impression  Dysphagia Diagnosis: Mild-mod pharyngeal phase dysphagia  Clinical impression: Pt presents with a mild-mod pharyngeal  dysphagia marked by premature spillage of POs into pharynx,  immediate aspiration of thin liquids (not consistently  accompanied by a cough), and mild vallecular residue of solids  post-swallow.  Pt with fluctuating alertness during study,  requiring frequent prompts to remain awake.  Compensatory  techniques were not effective due to mentation.  Recommend  initiating a dysphagia 3 diet with nectar-thick liquids; meds  crushed with puree.  SLP to follow for diet toleration and  advancement.     Treatment Recommendation  Therapy as outlined in treatment plan below    Diet Recommendation Dysphagia 3 (Mechanical Soft);Nectar-thick  liquid   Liquid Administration via: Cup Medication Administration: Crushed with puree Supervision: Full supervision/cueing for compensatory strategies Compensations: Slow rate;Small sips/bites (hold tray if coughing) Postural Changes and/or Swallow Maneuvers: Seated upright 90  degrees    Other  Recommendations Oral Care Recommendations: Oral care BID   Follow Up Recommendations   (tba)    Frequency and Duration min 3x week  2 weeks        SLP Swallow Goals     General Date of Onset: 12/26/14     Type of Study: Modified Barium Swallowing Study Reason for Referral: Objectively evaluate swallowing function Previous  Swallow Assessment: clinical swallow evaluation 12/28/14 Diet Prior to this Study: NPO Temperature Spikes Noted: No Respiratory Status: Room  air History of Recent Intubation: No Behavior/Cognition: Lethargic Oral Cavity - Dentition: Adequate natural dentition Self-Feeding Abilities: Able to feed self Patient Positioning: Upright in chair Baseline Vocal Quality: Wet Volitional Cough: Weak;Congested Volitional Swallow: Able to elicit Anatomy:  (presence of likely osteophytes C4-5 - no radiologist  present to confirm) Pharyngeal Secretions: Not observed secondary MBS    Reason for Referral Objectively evaluate swallowing function   Oral Phase Oral Preparation/Oral Phase Oral Phase: WFL   Pharyngeal Phase Pharyngeal Phase Pharyngeal Phase: Impaired Pharyngeal - Nectar Pharyngeal - Nectar Cup: Premature spillage to valleculae;Reduced  tongue base retraction Pharyngeal - Thin Pharyngeal - Thin Cup: Premature spillage to pyriform  sinuses;Reduced airway/laryngeal closure;Penetration/Aspiration  during swallow;Penetration/Aspiration after swallow;Trace  aspiration Penetration/Aspiration details (thin cup): Material enters  airway, passes BELOW cords without attempt by patient to eject  out (silent aspiration);Material enters airway, passes BELOW  cords and not ejected out despite cough attempt by patient Pharyngeal - Thin Straw: Premature spillage to pyriform  sinuses;Reduced airway/laryngeal closure;Penetration/Aspiration  during swallow;Penetration/Aspiration after swallow;Trace  aspiration Penetration/Aspiration details (thin straw): Material enters  airway, passes BELOW cords and not ejected out despite cough  attempt by patient;Material enters airway, passes BELOW cords  without attempt by patient to eject out (silent aspiration) Pharyngeal - Solids Pharyngeal - Puree: Premature spillage to valleculae;Reduced  tongue base retraction;Pharyngeal residue - valleculae Pharyngeal - Regular: Premature spillage to  valleculae;Reduced  tongue base retraction;Pharyngeal residue - valleculae     Amanda L. Samson Frederic, [place] CCC/SLP Pager [phone]               Blenda Mounts Laurice 12/28/2014, 2:03 PM      Medications:   . propofol 30 mcg/kg/min (12/29/14 0932)   . antiseptic oral rinse  7 mL Mouth Rinse QID  . aspirin EC  325 mg Oral QPM  . atenolol  50 mg Per Tube QPM  . calcium acetate  1,334 mg Oral TID WC  . chlorhexidine  15 mL Mouth Rinse BID  . furosemide  120 mg Intravenous Q8H  . heparin  5,000 Units Subcutaneous 3 times per day  . insulin aspart  0-9 Units Subcutaneous 6 times per day  . levothyroxine  50 mcg Per Tube QAC breakfast  . multivitamin  1 tablet Oral QHS  . pantoprazole sodium  40 mg Per Tube Daily  . piperacillin-tazobactam (ZOSYN)  IV  2.25 g Intravenous 4 times per day  . simvastatin  20 mg Per Tube QPM  . sodium bicarbonate  650 mg Oral BID  . sodium chloride  3 mL Intravenous Q12H   acetaminophen (TYLENOL) oral liquid 160 mg/5 mL, albuterol, fentaNYL, [DISCONTINUED] ondansetron **OR** ondansetron (ZOFRAN) IV  Assessment/ Plan:  Edward [name] is a 68yo man w/ PMHx of CVA with left-sided hemiparesis, Type 2 DM, CKD Stage 4/5 and HTN who came from [facility] after developing worsening AKI on CKD and respiratory distress. Patient then with minimal UOP and worsening AKI. HD was started 1/13 and ~2 L taken off. Patient then developed worsening respiratory distress with aspiration after eating   ARF vs CKD This may be some progression of disease Creatinine has increased Last labs 18 months ago Cr 1.4   Hyerkalemia improved  Metabolic acidosis will continue to follow  Volume overload minimal urine output appears to be having some increase shortness of breath  Will plan dialysis in AM    LOS: 3 [place] W @TODAY @10 :20 AM

## 2014-12-29 NOTE — Progress Notes (Addendum)
INITIAL NUTRITION ASSESSMENT  DOCUMENTATION CODES Per approved criteria  -Obesity Unspecified   INTERVENTION:  Initiate TF via OGT with Nepro at 20 ml/h and Prostat 30 ml QID on day 1; on day 2, continue at goal rate of 20 ml/h (480 ml per day) to provide 1264 kcals, 99 gm protein, 349 ml free water daily.  Above TF regimen plus current propofol will provide a total of 1731 kcals (25 kcals/kg ideal weight).  Unable to meet protein needs at this time due to use of Propofol.  NUTRITION DIAGNOSIS: Inadequate oral intake related to inability to eat as evidenced by NPO status.   Goal: Intake to meet >90% of estimated nutrition needs.  Monitor:  TF tolerance/adequacy, weight trend, labs, vent status.  Reason for Assessment: MD Consult for TF initiation and management.  69 y.o. male  Admitting Dx: Acute renal failure  ASSESSMENT: Patient came to [facility] from [facility] on 1/11 after developing worsening AKI on CKD and respiratory distress. Intubated on 1/13.   Nutrition focused physical exam completed.  No muscle or subcutaneous fat depletion noticed. 2+ pitting edema to LE. Unsure of usual/dry weight. Suspect current weight is above usual weight due to fluid overload. S/P first HD treatment on 1/13, ARF vs progression of CKD. Patient had a MBS with SLP on 1/13, reccomended dysphagia 3 diet with nectar thick liquids. Later that day he required intubation.  Patient is currently intubated on ventilator support MV: 7.6 L/min Temp (24hrs), Avg:97.6 F (36.4 C), Min:97.3 F (36.3 C), Max:98 F (36.7 C)  Propofol: 17.7 ml/hr providing 467 kcals/day.  Height: Ht Readings from Last 1 Encounters:  12/26/14  (1.727 m)    Weight: Wt Readings from Last 1 Encounters:  12/29/14 214 lb 4.6 oz (97.2 kg)    Ideal Body Weight: 70 kg  % Ideal Body Weight: 139%  Wt Readings from Last 10 Encounters:  12/29/14 214 lb 4.6 oz (97.2 kg)    Usual Body Weight: unknown  % Usual  Body Weight: N/A  BMI:  Body mass index is 32.59 kg/(m^2).  Estimated Nutritional Needs: Kcal: 1743 Protein: 130-150 gm Fluid: 1.7 L or per MD  Skin: ecchymosis  Diet Order: Diet NPO time specified  EDUCATION NEEDS: -Education not appropriate at this time   Intake/Output Summary (Last 24 hours) at 12/29/14 1501 Last data filed at 12/29/14 1100  Gross per 24 hour  Intake  509.2 ml  Output   4865 ml  Net -4355.8 ml    Last BM: None documented since admission    Labs:   Recent Labs Lab 12/26/14 2041 12/27/14 0349 12/28/14 0535 12/29/14 0410  NA 137 138 131* 139  K 5.2* 5.1 5.0 4.5  CL 111 112 107 111  CO2 11* 12* 11* 17*  BUN 91* 92* 107* 85*  CREATININE 5.95* 5.93* 6.50* 5.55*  CALCIUM 6.3* 6.3* 6.2* 6.6*  PHOS 9.7* 9.7* 9.4*  --   GLUCOSE 293* 197* 155* 109*    CBG (last 3)   Recent Labs  12/29/14 0434 12/29/14 0739 12/29/14 1221  GLUCAP 106* 99 89    Scheduled Meds: . antiseptic oral rinse  7 mL Mouth Rinse QID  . aspirin EC  325 mg Oral QPM  . atenolol  50 mg Per Tube QPM  . calcium acetate  1,334 mg Oral TID WC  . chlorhexidine  15 mL Mouth Rinse BID  . feeding supplement (VITAL HIGH PROTEIN)  1,000 mL Per Tube Q24H  . furosemide  120  mg Intravenous Q8H  . heparin  5,000 Units Subcutaneous 3 times per day  . insulin aspart  0-9 Units Subcutaneous 6 times per day  . levothyroxine  50 mcg Per Tube QAC breakfast  . multivitamin  1 tablet Oral QHS  . pantoprazole sodium  40 mg Per Tube Daily  . piperacillin-tazobactam (ZOSYN)  IV  2.25 g Intravenous 4 times per day  . simvastatin  20 mg Per Tube QPM  . sodium bicarbonate  650 mg Oral BID  . sodium chloride  3 mL Intravenous Q12H    Continuous Infusions: . propofol 29.98 mcg/kg/min (12/29/14 1200)    Past Medical History  Diagnosis Date  . Hypertension   . Hypothyroidism   . Diabetes mellitus without complication     Past Surgical History  Procedure Laterality Date  . Chest tube  placement      Joaquin CourtsKimberly Harris, RD, LDN, CNSC Pager [phone] After Hours Pager [phone]

## 2014-12-29 NOTE — Progress Notes (Addendum)
PULMONARY / CRITICAL CARE MEDICINE   Name: Edward [name] MRN: [id number] DOB: 10-14-1946    ADMISSION DATE:  12/26/2014 CONSULTATION DATE:  12/28/14  REFERRING MD :  Eduard Clos  CHIEF COMPLAINT:  Acute respiratory failure  INITIAL PRESENTATION: Edward [name] is a 69yo man w/ PMHx of CVA with left-sided hemiparesis, Type 2 DM, CKD Stage 4/5 and HTN who came from [facility] after developing worsening AKI on CKD and respiratory distress. Patient then with minimal UOP and worsening AKI. HD was started 1/13 and ~2 L taken off. Patient then developed worsening respiratory distress with aspiration after eating. PCCM was consulted.   STUDIES:  CXR 1/11>> improving R basilar aeration with decreasing airspace opacity CXR 1/13>> Mild vascular congestion  SIGNIFICANT EVENTS: 1/9>> Pt admitted at [facility] for weakness, peripheral edema 1/11>> Transferred to [facility] for worsening AKI on CKD, respiratory distress 1/13>> First HD session, ~2 L taken off 1/13>> Developed worsening respiratory distress, aspiration. PCCM consulted  SUBJECTIVE: Intubated, sedated.  VITAL SIGNS: Temp:  [97.5 F (36.4 C)-98 F (36.7 C)] 97.5 F (36.4 C) (01/14 0439) Pulse Rate:  [49-76] 49 (01/14 0630) Resp:  [11-22] 14 (01/14 0630) BP: (77-174)/(42-96) 122/59 mmHg (01/14 0630) SpO2:  [93 %-100 %] 100 % (01/14 0630) FiO2 (%):  [40 %-100 %] 40 % (01/14 0600) Weight:  [214 lb 4.6 oz (97.2 kg)-222 lb 7.1 oz (100.9 kg)] 214 lb 4.6 oz (97.2 kg) (01/14 0500) HEMODYNAMICS:   VENTILATOR SETTINGS: Vent Mode:  [-] PRVC FiO2 (%):  [40 %-100 %] 40 % Set Rate:  [14 bmp] 14 bmp Vt Set:  [550 mL] 550 mL PEEP:  [5 cmH20] 5 cmH20 Plateau Pressure:  [13 cmH20-20 cmH20] 19 cmH20 INTAKE / OUTPUT:  Intake/Output Summary (Last 24 hours) at 12/29/14 0736 Last data filed at 12/29/14 4098  Gross per 24 hour  Intake  360.7 ml  Output   4005 ml  Net -3644.3 ml    PHYSICAL EXAMINATION: General: sedated on  vent Neuro:  Sedated on vent. RASS -2 HEENT: [place]/AT, PERRL.  Cardiovascular: RRR, no m/g/r Lungs: CTA bilaterally, no wheezes Abdomen: BS+, obese, soft, non-tender Musculoskeletal: 2+ pitting edema in lower extremities Skin:  Warm, well perfused  LABS:  CBC  Recent Labs Lab 12/26/14 2041 12/27/14 0349 12/29/14 0410  WBC 13.7* 11.7* 9.9  HGB 9.1* 8.4* 8.1*  HCT 28.0* 25.6* 23.9*  PLT 186 161 129*   Coag's No results for input(s): APTT, INR in the last 168 hours. BMET  Recent Labs Lab 12/27/14 0349 12/28/14 0535 12/29/14 0410  NA 138 131* 139  K 5.1 5.0 4.5  CL 112 107 111  CO2 12* 11* 17*  BUN 92* 107* 85*  CREATININE 5.93* 6.50* 5.55*  GLUCOSE 197* 155* 109*   Electrolytes  Recent Labs Lab 12/26/14 2041 12/27/14 0349 12/28/14 0535 12/29/14 0410  CALCIUM 6.3* 6.3* 6.2* 6.6*  PHOS 9.7* 9.7* 9.4*  --    Sepsis Markers  Recent Labs Lab 12/26/14 2158  LATICACIDVEN 1.3   ABG  Recent Labs Lab 12/26/14 2106 12/28/14 2022  PHART 7.206* 7.241*  PCO2ART 30.9* 40.6  PO2ART 130.0* 433.0*   Liver Enzymes  Recent Labs Lab 12/26/14 2041 12/27/14 0349 12/28/14 0535  AST 14  --   --   ALT 14  --   --   ALKPHOS 45  --   --   BILITOT 0.5  --   --   ALBUMIN 2.7* 2.4* 2.6*   Cardiac Enzymes No results  for input(s): TROPONINI, PROBNP in the last 168 hours. Glucose  Recent Labs Lab 12/27/14 1227 12/27/14 1805 12/28/14 1206 12/28/14 1957 12/29/14 0032 12/29/14 0434  GLUCAP 166* 203* 139* 94 103* 106*    Imaging Dg Chest Port 1 View  12/28/2014   CLINICAL DATA:  Intubation, respiratory failure  EXAM: PORTABLE CHEST - 1 VIEW  COMPARISON:  12/28/2014  FINDINGS: Endotracheal tube 5 cm above the carina. Right IJ dialysis catheter tip SVC RA junction level. Low lung volumes noted with mild vascular congestion and basilar atelectasis. Normal heart size. No effusion or pneumothorax. Stable exam.  IMPRESSION: Endotracheal tube 5 cm above the carina.   Stable low lung volumes and vascular congestion   Electronically Signed   By: Ruel Favors M.D.   On: 12/28/2014 19:58   Dg Chest Port 1 View  12/28/2014   CLINICAL DATA:  Central line placement.  EXAM: PORTABLE CHEST - 1 VIEW  COMPARISON:  Single view of the chest 12/26/2014.  FINDINGS: Right IJ catheter is in place with the tip just within the right atrium. The tube could be withdrawn 2 cm for positioning at the superior cavoatrial junction. Lung volumes are low with right basilar airspace disease again seen. Heart size is normal. There is no pneumothorax. Right pleural effusion is noted.  IMPRESSION: Right IJ catheter tip projects within the right atrium. The line could be withdrawn 2 cm for positioning of the superior cavoatrial junction. Negative for pneumothorax.  No change in right effusion and basilar airspace disease.   Electronically Signed   By: Drusilla Kanner M.D.   On: 12/28/2014 13:41   Dg Abd Portable 1v  12/29/2014   CLINICAL DATA:  Orogastric tube placement.  Initial encounter.  EXAM: PORTABLE ABDOMEN - 1 VIEW  COMPARISON:  None.  FINDINGS: The patient's enteric tube is noted coiling at the body of the stomach and ending overlying the fundus of the stomach.  The visualized bowel gas pattern is grossly unremarkable. Contrast is seen filling the cecum, ascending colon and base of the appendix. Mild right basilar airspace opacity may reflect atelectasis. No acute osseous abnormalities are seen.  IMPRESSION: 1. Enteric tube noted coiling at the body of the stomach and ending overlying the fundus of the stomach. 2. Mild right basilar airspace opacity may reflect atelectasis.   Electronically Signed   By: Roanna Raider M.D.   On: 12/29/2014 02:38   Dg Swallowing Func-speech Pathology  12/28/2014   Carolan Shiver, CCC-SLP     12/28/2014  2:04 PM Objective Swallowing Evaluation: Modified Barium Swallowing Study   Patient Details  Name: Edward [name] MRN: [id number] Date of Birth:  [birth year]  Today's Date: 12/28/2014 Time: 1100-1130 SLP Time Calculation (min) (ACUTE ONLY): 30 min  Past Medical History:  Past Medical History  Diagnosis Date  . Hypertension   . Hypothyroidism   . Diabetes mellitus without complication    Past Surgical History:  Past Surgical History  Procedure Laterality Date  . Chest tube placement     HPI:  Pt is 69 y.o. 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  He reports  swallowing difficulty immediately post-stroke, but not in recent  years.  Poor performance during clinical swallow eval with  concerns for aspiration, so MBS recommended.        Assessment / Plan / Recommendation Clinical Impression  Dysphagia Diagnosis: Mild-mod pharyngeal phase dysphagia  Clinical impression: Pt presents with  a mild-mod pharyngeal  dysphagia marked by premature spillage of POs into pharynx,  immediate aspiration of thin liquids (not consistently  accompanied by a cough), and mild vallecular residue of solids  post-swallow.  Pt with fluctuating alertness during study,  requiring frequent prompts to remain awake.  Compensatory  techniques were not effective due to mentation.  Recommend  initiating a dysphagia 3 diet with nectar-thick liquids; meds  crushed with puree.  SLP to follow for diet toleration and  advancement.     Treatment Recommendation  Therapy as outlined in treatment plan below    Diet Recommendation Dysphagia 3 (Mechanical Soft);Nectar-thick  liquid   Liquid Administration via: Cup Medication Administration: Crushed with puree Supervision: Full supervision/cueing for compensatory strategies Compensations: Slow rate;Small sips/bites (hold tray if coughing) Postural Changes and/or Swallow Maneuvers: Seated upright 90  degrees    Other  Recommendations Oral Care Recommendations: Oral care BID   Follow Up Recommendations   (tba)    Frequency and Duration min 3x week  2 weeks        SLP Swallow Goals     General Date  of Onset: 12/26/14     Type of Study: Modified Barium Swallowing Study Reason for Referral: Objectively evaluate swallowing function Previous Swallow Assessment: clinical swallow evaluation 12/28/14 Diet Prior to this Study: NPO Temperature Spikes Noted: No Respiratory Status: Room air History of Recent Intubation: No Behavior/Cognition: Lethargic Oral Cavity - Dentition: Adequate natural dentition Self-Feeding Abilities: Able to feed self Patient Positioning: Upright in chair Baseline Vocal Quality: Wet Volitional Cough: Weak;Congested Volitional Swallow: Able to elicit Anatomy:  (presence of likely osteophytes C4-5 - no radiologist  present to confirm) Pharyngeal Secretions: Not observed secondary MBS    Reason for Referral Objectively evaluate swallowing function   Oral Phase Oral Preparation/Oral Phase Oral Phase: WFL   Pharyngeal Phase Pharyngeal Phase Pharyngeal Phase: Impaired Pharyngeal - Nectar Pharyngeal - Nectar Cup: Premature spillage to valleculae;Reduced  tongue base retraction Pharyngeal - Thin Pharyngeal - Thin Cup: Premature spillage to pyriform  sinuses;Reduced airway/laryngeal closure;Penetration/Aspiration  during swallow;Penetration/Aspiration after swallow;Trace  aspiration Penetration/Aspiration details (thin cup): Material enters  airway, passes BELOW cords without attempt by patient to eject  out (silent aspiration);Material enters airway, passes BELOW  cords and not ejected out despite cough attempt by patient Pharyngeal - Thin Straw: Premature spillage to pyriform  sinuses;Reduced airway/laryngeal closure;Penetration/Aspiration  during swallow;Penetration/Aspiration after swallow;Trace  aspiration Penetration/Aspiration details (thin straw): Material enters  airway, passes BELOW cords and not ejected out despite cough  attempt by patient;Material enters airway, passes BELOW cords  without attempt by patient to eject out (silent aspiration) Pharyngeal - Solids Pharyngeal - Puree: Premature  spillage to valleculae;Reduced  tongue base retraction;Pharyngeal residue - valleculae Pharyngeal - Regular: Premature spillage to valleculae;Reduced  tongue base retraction;Pharyngeal residue - valleculae     Amanda L. Samson Frederic, [place] CCC/SLP Pager [phone]               Blenda Mounts Laurice 12/28/2014, 2:03 PM     CXR 1/14 - bibasilar atelectasis and/or infiltrates  ASSESSMENT / PLAN:  PULMONARY OETT 1/13>> A: Acute respiratory failure likely due to volume overload from ARF and aspiration  ?PNA- currently on Zosyn P:   Full vent support SBT/WUA Continue Zosyn  CARDIOVASCULAR CVL R IJ HD TLC 1/13 >> A: ?CHF with pulm HTN- noted on echo at [place]. EF 55-60%. No records in EPIC.  Hx HTN  P:  Continue Atenolol 50 mg daily Continue ASA Continue statin  RENAL  A:  ARF vs. CKD- likely acute on chronic. Started HD 1/13 Hyponatremia 2/2 fluid overload - resolved Metabolic acidosis - improving P:   Renal following, appreciate recommendations Continue HD per Renal Lasix Repeat BMP this afternoon  GASTROINTESTINAL A:  No acute issues P:   Continue Protonix TF  HEMATOLOGIC A:  Anemia, likely due to chronic disease. [facility] - stable P:  Continue to monitor  INFECTIOUS A:  ?PNA- started on antibiotics on 1/11, no consolidation on CXR. Risk for aspiration PNA. - leukocytosis improved P:   BCx2 1/12>> NGTD Sputum 1/13>>  Azithromycin 1/11>>1/13 Rocephin 1/11>>1/13 Zosyn 1/13>> consider d/c 1/15  ENDOCRINE A:  Type 2 DM Hypothyroidism P:   Sensitive ISS CBGs 4 times daily Continue Synthroid   NEUROLOGIC A: Sedated on vent Hx CVA with residual left-sided hemiparesis ?Acute metabolic encephalopathy 2/2 ARF P:   RASS goal: -1 Sedation with fentanyl and propofol Continue to monitor mental status   FAMILY  - Updates: Joretta BachelorCousin, Robin, and her husband updated at bedside 1/14  - Inter-disciplinary family meet or Palliative Care meeting due  by: 01/04/15  Griffin BasilJennifer Krall, MD  Pulmonary and Critical Care Medicine [facility] Pager: [phone]  12/29/2014, 7:36 AM  PCCM ATTENDING: I have reviewed pt's initial presentation, consultants notes and hospital database in detail.  The above assessment and plan was formulated under my direction.  In summary: Recent CVA DM 2 AKI CKD Intubated 1/13 for AMS Vent status stable Hemodynamically stable Uo excellent on furosemide gtt Cr improved Severe hypervolemia by exam Anemia without aute blood loss evident  Cont full vent support - settings reviewed and/or adjusted Cont vent bundle Daily SBT if/when meets criteria Monitor BMET frequently while on Lasix gtt Monitor I/Os Correct electrolytes as indicated DVT px: SQ heparin Monitor CBC intermittently Transfuse per usual ICU guidelines Cont empiric abx for now - likely stop 1/15  Billy Fischeravid Simonds, MD;  PCCM service; Mobile [phone]

## 2014-12-29 NOTE — Progress Notes (Signed)
UR Completed.  [phone]  

## 2014-12-29 NOTE — Progress Notes (Signed)
ANTIBIOTIC CONSULT NOTE - FOLLOW-UP  Pharmacy Consult for Zosyn Indication: pneumonia  No Known Allergies  Patient Measurements: Height: 5\' 8"  (172.7 cm) Weight: 214 lb 4.6 oz (97.2 kg) IBW/kg (Calculated) : 68.4  Vital Signs: Temp: 97.4 F (36.3 C) (01/14 1230) Temp Source: Oral (01/14 1230) BP: 136/55 mmHg (01/14 1100) Pulse Rate: 52 (01/14 1158) Intake/Output from previous day: 01/13 0701 - 01/14 0700 In: 438.4 [I.V.:154.4; NG/GT:60; IV Piggyback:224] Out: 4005 [Urine:1505] Intake/Output from this shift: Total I/O In: 17.7 [I.V.:17.7] Out: 100 [Urine:100]  Labs:  Recent Labs  12/26/14 2041 12/27/14 0349 12/27/14 0849 12/28/14 0535 12/29/14 0410  WBC 13.7* 11.7*  --   --  9.9  HGB 9.1* 8.4*  --   --  8.1*  PLT 186 161  --   --  129*  LABCREA  --   --  64.08  63.24  --   --   CREATININE 5.95* 5.93*  --  6.50* 5.55*   Estimated Creatinine Clearance: 14.4 mL/min (by C-G formula based on Cr of 5.55). No results for input(s): VANCOTROUGH, VANCOPEAK, VANCORANDOM, GENTTROUGH, GENTPEAK, GENTRANDOM, TOBRATROUGH, TOBRAPEAK, TOBRARND, AMIKACINPEAK, AMIKACINTROU, AMIKACIN in the last 72 hours.   Microbiology: Recent Results (from the past 720 hour(s))  MRSA PCR Screening     Status: None   Collection Time: 12/26/14  7:57 PM  Result Value Ref Range Status   MRSA by PCR NEGATIVE NEGATIVE Final    Comment:        The GeneXpert MRSA Assay (FDA approved for NASAL specimens only), is one component of a comprehensive MRSA colonization surveillance program. It is not intended to diagnose MRSA infection nor to guide or monitor treatment for MRSA infections.   Culture, blood (routine x 2) Call MD if unable to obtain prior to antibiotics being given     Status: None (Preliminary result)   Collection Time: 12/27/14  5:30 AM  Result Value Ref Range Status   Specimen Description BLOOD RIGHT HAND  Final   Special Requests BOTTLES DRAWN AEROBIC ONLY 10CC  Final   Culture    Final           BLOOD CULTURE RECEIVED NO GROWTH TO DATE CULTURE WILL BE HELD FOR 5 DAYS BEFORE ISSUING A FINAL NEGATIVE REPORT Performed at [facility]    Report Status PENDING  Incomplete  Culture, blood (routine x 2) Call MD if unable to obtain prior to antibiotics being given     Status: None (Preliminary result)   Collection Time: 12/27/14  5:35 AM  Result Value Ref Range Status   Specimen Description BLOOD RIGHT ARM  Final   Special Requests BOTTLES DRAWN AEROBIC ONLY 10CC  Final   Culture   Final           BLOOD CULTURE RECEIVED NO GROWTH TO DATE CULTURE WILL BE HELD FOR 5 DAYS BEFORE ISSUING A FINAL NEGATIVE REPORT Performed at [facility]    Report Status PENDING  Incomplete  Culture, respiratory (NON-Expectorated)     Status: None (Preliminary result)   Collection Time: 12/28/14  8:11 PM  Result Value Ref Range Status   Specimen Description TRACHEAL ASPIRATE  Final   Special Requests NONE  Final   Gram Stain PENDING  Incomplete   Culture   Final    NO GROWTH 1 DAY Performed at [facility]    Report Status PENDING  Incomplete    Medical History: Past Medical History  Diagnosis Date  . Hypertension   .  Hypothyroidism   . Diabetes mellitus without complication     Assessment: 69 YOM admitted with acute renal failure in addition to probable CKD. Patient has mild acute respiratory distress and probable aspiration, per MD. Antimicrobial therapy was broadened to Zosyn. Patient has been febrile for 24 hours, WBC WNL, LA 1.3. Renal function slowly improving. SCr was 6.1mg /dL at admission and 1.6XWR/UE at baseline 18 months ago.Current SCr has decreased to 5.55mg /dL and CrCl ~45WU/JWJ. Patient being followed by Nephrology and received HD on 12/28/14 at a blood flow rate of 242mL/min for 3 hours due to fluid overload, in which they were able to remove ~2576mL of fluid. Nephrology planning another HD session in the morning of 12/30/14.  Goal of Therapy:   Eradication of infection  Plan:  -Change Zosyn to 2.25g IV q8h - HD dosing -Monitor renal function/recovery -F/U clinical progress, antibotic plan, duration of therapy, and HD schedule   Thomasene Ripple, PharmD Candidate  I agree with assessment and plan.  Christoper Fabian, PharmD, BCPS Clinical pharmacist, pager [phone] 12/29/2014 1:49 PM

## 2014-12-30 DIAGNOSIS — N184 Chronic kidney disease, stage 4 (severe): Secondary | ICD-10-CM

## 2014-12-30 LAB — RENAL FUNCTION PANEL
ALBUMIN: 2.3 g/dL — AB (ref 3.5–5.2)
Anion gap: 16 — ABNORMAL HIGH (ref 5–15)
BUN: 89 mg/dL — ABNORMAL HIGH (ref 6–23)
CHLORIDE: 106 meq/L (ref 96–112)
CO2: 20 mmol/L (ref 19–32)
CREATININE: 5.52 mg/dL — AB (ref 0.50–1.35)
Calcium: 6.9 mg/dL — ABNORMAL LOW (ref 8.4–10.5)
GFR calc Af Amer: 11 mL/min — ABNORMAL LOW (ref >90)
GFR calc non Af Amer: 10 mL/min — ABNORMAL LOW (ref >90)
GLUCOSE: 112 mg/dL — AB (ref 70–99)
Phosphorus: 7.9 mg/dL — ABNORMAL HIGH (ref 2.3–4.6)
Potassium: 4.8 mmol/L (ref 3.5–5.1)
Sodium: 142 mmol/L (ref 135–145)

## 2014-12-30 LAB — CBC
HCT: 24.7 % — ABNORMAL LOW (ref 39.0–52.0)
Hemoglobin: 8.2 g/dL — ABNORMAL LOW (ref 13.0–17.0)
MCH: 29.2 pg (ref 26.0–34.0)
MCHC: 33.2 g/dL (ref 30.0–36.0)
MCV: 87.9 fL (ref 78.0–100.0)
Platelets: 141 K/uL — ABNORMAL LOW (ref 150–400)
RBC: 2.81 MIL/uL — ABNORMAL LOW (ref 4.22–5.81)
RDW: 15.8 % — ABNORMAL HIGH (ref 11.5–15.5)
WBC: 10 K/uL (ref 4.0–10.5)

## 2014-12-30 LAB — BASIC METABOLIC PANEL WITH GFR
Anion gap: 12 (ref 5–15)
BUN: 89 mg/dL — ABNORMAL HIGH (ref 6–23)
CO2: 19 mmol/L (ref 19–32)
Calcium: 6.7 mg/dL — ABNORMAL LOW (ref 8.4–10.5)
Chloride: 106 meq/L (ref 96–112)
Creatinine, Ser: 5.6 mg/dL — ABNORMAL HIGH (ref 0.50–1.35)
GFR calc Af Amer: 11 mL/min — ABNORMAL LOW
GFR calc non Af Amer: 9 mL/min — ABNORMAL LOW
Glucose, Bld: 115 mg/dL — ABNORMAL HIGH (ref 70–99)
Potassium: 4.7 mmol/L (ref 3.5–5.1)
Sodium: 137 mmol/L (ref 135–145)

## 2014-12-30 LAB — UIFE/LIGHT CHAINS/TP QN, 24-HR UR
ALPHA 1 UR: DETECTED — AB
Albumin, U: DETECTED
Alpha 2, Urine: DETECTED — AB
Beta, Urine: DETECTED — AB
Gamma Globulin, Urine: DETECTED — AB
TOTAL PROTEIN, URINE-UPE24: 290 mg/dL — AB (ref 5–25)

## 2014-12-30 LAB — BASIC METABOLIC PANEL
Anion gap: 11 (ref 5–15)
BUN: 54 mg/dL — ABNORMAL HIGH (ref 6–23)
CALCIUM: 7.4 mg/dL — AB (ref 8.4–10.5)
CO2: 23 mmol/L (ref 19–32)
Chloride: 105 mEq/L (ref 96–112)
Creatinine, Ser: 4.19 mg/dL — ABNORMAL HIGH (ref 0.50–1.35)
GFR calc Af Amer: 15 mL/min — ABNORMAL LOW (ref >90)
GFR calc non Af Amer: 13 mL/min — ABNORMAL LOW (ref >90)
GLUCOSE: 106 mg/dL — AB (ref 70–99)
Potassium: 4.1 mmol/L (ref 3.5–5.1)
Sodium: 139 mmol/L (ref 135–145)

## 2014-12-30 LAB — GLUCOSE, CAPILLARY
GLUCOSE-CAPILLARY: 106 mg/dL — AB (ref 70–99)
GLUCOSE-CAPILLARY: 120 mg/dL — AB (ref 70–99)
Glucose-Capillary: 105 mg/dL — ABNORMAL HIGH (ref 70–99)
Glucose-Capillary: 92 mg/dL (ref 70–99)
Glucose-Capillary: 97 mg/dL (ref 70–99)
Glucose-Capillary: 89 mg/dL (ref 70–99)

## 2014-12-30 LAB — PARATHYROID HORMONE, INTACT (NO CA)
PTH: 110 pg/mL — ABNORMAL HIGH (ref 15–65)
PTH: 110 pg/mL

## 2014-12-30 MED ORDER — HEPARIN SODIUM (PORCINE) 1000 UNIT/ML DIALYSIS: 1000.0000 [IU] | INTRAMUSCULAR | Status: DC | PRN

## 2014-12-30 MED ORDER — ATENOLOL 50 MG PO TABS: 50.0000 mg | ORAL_TABLET | Freq: Every evening | ORAL | Status: DC

## 2014-12-30 MED ORDER — HYDRALAZINE HCL 20 MG/ML IJ SOLN
10.0000 mg | Freq: Three times a day (TID) | INTRAMUSCULAR | Status: DC | PRN
  Administered 2014-12-31: 10 mg via INTRAVENOUS
  Filled 2014-12-30: qty 1

## 2014-12-30 MED ORDER — LIDOCAINE HCL (PF) 1 % IJ SOLN: 5.0000 mL | INTRAMUSCULAR | Status: DC | PRN

## 2014-12-30 MED ORDER — INSULIN ASPART 100 UNIT/ML ~~LOC~~ SOLN
0.0000 [IU] | Freq: Three times a day (TID) | SUBCUTANEOUS | Status: DC
  Administered 2015-01-02: 2 [IU] via SUBCUTANEOUS
  Administered 2015-01-02: 3 [IU] via SUBCUTANEOUS
  Administered 2015-01-02 – 2015-01-04 (×6): 1 [IU] via SUBCUTANEOUS
  Administered 2015-01-05: 2 [IU] via SUBCUTANEOUS
  Administered 2015-01-05: 1 [IU] via SUBCUTANEOUS
  Administered 2015-01-06: 2 [IU] via SUBCUTANEOUS
  Administered 2015-01-06: 1 [IU] via SUBCUTANEOUS

## 2014-12-30 MED ORDER — SODIUM CHLORIDE 0.9 % IV SOLN: 100.0000 mL | INTRAVENOUS | Status: DC | PRN

## 2014-12-30 MED ORDER — HEPARIN SODIUM (PORCINE) 1000 UNIT/ML DIALYSIS
1000.0000 [IU] | INTRAMUSCULAR | Status: DC | PRN
  Filled 2014-12-30: qty 1

## 2014-12-30 MED ORDER — NEPRO/CARBSTEADY PO LIQD: 237.0000 mL | ORAL | Status: DC | PRN

## 2014-12-30 MED ORDER — ALTEPLASE 2 MG IJ SOLR
2.0000 mg | Freq: Once | INTRAMUSCULAR | Status: DC | PRN
Start: 2014-12-30 — End: 2014-12-30

## 2014-12-30 MED ORDER — LIDOCAINE-PRILOCAINE 2.5-2.5 % EX CREA: 1.0000 "application " | TOPICAL_CREAM | CUTANEOUS | Status: DC | PRN

## 2014-12-30 MED ORDER — PENTAFLUOROPROP-TETRAFLUOROETH EX AERO: 1.0000 "application " | INHALATION_SPRAY | CUTANEOUS | Status: DC | PRN

## 2014-12-30 MED ORDER — FUROSEMIDE 10 MG/ML IJ SOLN
120.0000 mg | Freq: Three times a day (TID) | INTRAVENOUS | Status: DC
  Administered 2014-12-31 – 2015-01-05 (×16): 120 mg via INTRAVENOUS
  Filled 2014-12-30 (×20): qty 12

## 2014-12-30 MED ORDER — HYDRALAZINE HCL 20 MG/ML IJ SOLN: 10.0000 mg | Freq: Four times a day (QID) | INTRAMUSCULAR | Status: DC | PRN

## 2014-12-30 MED ORDER — NEPRO/CARBSTEADY PO LIQD
237.0000 mL | ORAL | Status: DC | PRN
  Filled 2014-12-30: qty 237

## 2014-12-30 MED ORDER — ALTEPLASE 2 MG IJ SOLR: 2.0000 mg | Freq: Once | INTRAMUSCULAR | Status: AC | PRN

## 2014-12-30 MED ORDER — INSULIN ASPART 100 UNIT/ML ~~LOC~~ SOLN
0.0000 [IU] | Freq: Every day | SUBCUTANEOUS | Status: DC
  Administered 2015-01-06: 2 [IU] via SUBCUTANEOUS

## 2014-12-30 NOTE — Progress Notes (Deleted)
PULMONARY / CRITICAL CARE MEDICINE   Name: Edward [name] MRN: [id number] DOB: 06/01/1946    ADMISSION DATE:  12/26/2014 CONSULTATION DATE:  12/28/14  REFERRING MD :  Eduard ClosArshad N Kakrakandy - [facility]  CHIEF COMPLAINT:  Acute respiratory failure  INITIAL PRESENTATION: [name] is a 69yo man w/ PMHx of CVA with left-sided hemiparesis, Type 2 DM, CKD Stage 4/5 and HTN who [facility] after developing worsening AKI on CKD and respiratory distress. Patient then with minimal UOP and worsening AKI. HD was started 1/13 and ~2 L taken off. Patient then developed worsening respiratory distress with aspiration after eating. PCCM was consulted.   STUDIES:  CXR 1/11>> improving R basilar aeration with decreasing airspace opacity CXR 1/13>> Mild vascular congestion CXR 1/14>> bibasilar atelectasis and/or infiltrates  SIGNIFICANT EVENTS: 1/9>> [facility] weakness, peripheral edema 1/11>> [facility] AKI on CKD, respiratory distress 1/13>> First HD session, ~2 L taken off 1/13>> Developed worsening respiratory distress, aspiration. PCCM consulted 1/15>> HD today  SUBJECTIVE: Intubated, sedated. Some agitation off of sedation. Follows commands.  VITAL SIGNS: Temp:  [95.4 F (35.2 C)-99.1 F (37.3 C)] 98.2 F (36.8 C) (01/15 0409) Pulse Rate:  [49-63] 60 (01/15 0711) Resp:  [14-18] 14 (01/15 0711) BP: (94-159)/(36-70) 157/53 mmHg (01/15 0630) SpO2:  [98 %-100 %] 100 % (01/15 0711) FiO2 (%):  [40 %] 40 % (01/15 0600) Weight:  [210 lb 15.7 oz (95.7 kg)] 210 lb 15.7 oz (95.7 kg) (01/15 0500) HEMODYNAMICS:   VENTILATOR SETTINGS: Vent Mode:  [-] PRVC FiO2 (%):  [40 %] 40 % Set Rate:  [14 bmp] 14 bmp Vt Set:  [550 mL] 550 mL PEEP:  [5 cmH20] 5 cmH20 Plateau Pressure:  [17 cmH20-18 cmH20] 17 cmH20 INTAKE / OUTPUT:  Intake/Output Summary (Last 24 hours) at 12/30/14 0717 Last data filed at 12/30/14 [id number]  Gross per 24 hour  Intake  1019.15 ml  Output   3475 ml  Net -2455.85 ml    PHYSICAL EXAMINATION: General: some agitation off sedation Neuro:  Opens eyes to voice, moves R side spontaneously HEENT: [place]/AT, PERRL.  Cardiovascular: RRR, no m/g/r Lungs: CTA bilaterally, no wheezes Abdomen: BS+, obese, soft, non-tender Musculoskeletal: 2+ pitting edema in lower extremities Skin:  Warm, well perfused  LABS:  CBC  Recent Labs Lab 12/27/14 0349 12/29/14 0410 12/30/14 0244  WBC 11.7* 9.9 10.0  HGB 8.4* 8.1* 8.2*  HCT 25.6*  24.8* 23.9* 24.7*  PLT 161 129* 141*   Coag's No results for input(s): APTT, INR in the last 168 hours. BMET  Recent Labs Lab 12/29/14 0410 12/29/14 1555 12/30/14 0244  NA 139 138 137  K 4.5 4.5 4.7  CL 111 108 106  CO2 17* 18* 19  BUN 85* 85* 89*  CREATININE 5.55* 5.52* 5.60*  GLUCOSE 109* 109* 115*   Electrolytes  Recent Labs Lab 12/26/14 2041 12/27/14 0349 12/28/14 0535 12/29/14 0410 12/29/14 1555 12/30/14 0244  CALCIUM 6.3* 6.3* 6.2* 6.6* 6.6* 6.7*  PHOS 9.7* 9.7* 9.4*  --   --   --    Sepsis Markers  Recent Labs Lab 12/26/14 2158  LATICACIDVEN 1.3   ABG  Recent Labs Lab 12/26/14 2106 12/28/14 2022  PHART 7.206* 7.241*  PCO2ART 30.9* 40.6  PO2ART 130.0* 433.0*   Liver Enzymes  Recent Labs Lab 12/26/14 2041 12/27/14 0349 12/28/14 0535  AST 14  --   --   ALT 14  --   --  ALKPHOS 45  --   --   BILITOT 0.5  --   --   ALBUMIN 2.7* 2.4* 2.6*   Cardiac Enzymes No results for input(s): TROPONINI, PROBNP in the last 168 hours. Glucose  Recent Labs Lab 12/29/14 0739 12/29/14 1221 12/29/14 1546 12/29/14 1933 12/30/14 0017 12/30/14 0410  GLUCAP 99 89 100* 95 120* 105*    Imaging Portable Chest Xray  12/29/2014   CLINICAL DATA:  Respiratory failure.  EXAM: PORTABLE CHEST - 1 VIEW  COMPARISON:  12/28/2014.  FINDINGS: Interim placement of NGT. Its tip is below the left hemidiaphragm. Endotracheal tube and right IJ line in stable  position. Low lung volumes with bibasilar atelectasis and/or infiltrates. No pleural effusion or pneumothorax. Stable cardiomegaly. Normal pulmonary vascularity.  IMPRESSION: 1. Interval placement NG tube, its tip is below the left hemidiaphragm. Endotracheal tube and right IJ line in stable position. 2. Low lung volumes with bibasilar atelectasis and/or infiltrates.   Electronically Signed   By: Maisie Fus  Register   On: 12/29/2014 07:29     ASSESSMENT / PLAN:  PULMONARY OETT 1/13>> A: Acute respiratory failure likely due to volume overload from ARF and aspiration  P:   SBT/WUA Extubate today  CARDIOVASCULAR CVL R IJ HD TLC 1/13 >> A:  Hx HTN  P:  Continue Atenolol 50 mg daily Continue ASA Continue statin  RENAL A:  ARF vs. CKD- likely acute on chronic. Started HD 1/13 Hyponatremia 2/2 fluid overload - resolved Metabolic acidosis - improving P:   Renal following, appreciate recommendations Continue HD per Renal Lasix Follow BMP  GASTROINTESTINAL A:  No acute issues P:   Continue Protonix TF Will resume diet later this evening - carb modified/renal   HEMATOLOGIC A:  Anemia, likely due to chronic disease. Received 2 [facility] at [facility]. - stable P:  Continue to monitor  INFECTIOUS A:  ?PNA- started on antibiotics on 1/11, no consolidation on CXR. Risk for aspiration PNA. - leukocytosis improved P:   BCx2 1/12>> NGTD Sputum 1/13>> No growth  Azithromycin 1/11>>1/13 Rocephin 1/11>>1/13 Zosyn 1/13>>1/15  ENDOCRINE A:  Type 2 DM Hypothyroidism P:   Sensitive ISS CBGs 4 times daily Continue Synthroid   NEUROLOGIC A: Sedated on vent Hx CVA with residual left-sided hemiparesis ?Acute metabolic encephalopathy 2/2 ARF P:   Continue to monitor mental status   FAMILY  - Updates: Joretta Bachelor, and her husband updated at bedside 1/14  - Inter-disciplinary family meet or Palliative Care meeting due by: 01/04/15  Griffin Basil, MD  Pulmonary and Critical  Care Medicine [facility] Pager: [phone]  12/30/2014, 7:17 AM

## 2014-12-30 NOTE — Progress Notes (Signed)
RT called to pts bedside for self extubation. BBS CRS RH. Pt has stridor. Strong PC white thin. Dr Sung AmabileSimonds at bedside no further orders at this time.  Deep oral sxn done times 3 -small white thin.  Pt on 2L sats 100.  RR 26. MD aware.

## 2014-12-30 NOTE — Clinical Documentation Improvement (Signed)
Supporting Information: CHF with pulmonary hypertension per 2D echo, continue Lasix, per 12/27/14 progress notes.   Possible Clinical Conditions: . Document acuity --Acute --Chronic --Acute on Chronic . Document type --Diastolic --Systolic --Combined systolic and diastolic . Due to or associated with --Cardiac or other surgery --Hypertension --Valvular disease --Rheumatic heart disease Endocarditis (valvitis) Pericarditis Myocarditis --Other (specify)    Thank Gabriel CirriYou,  Ellen Goris Mathews-Bethea,RN,BSN,Clinical Documentation Specialist [phone] [email or web address]

## 2014-12-30 NOTE — Progress Notes (Signed)
PULMONARY / CRITICAL CARE MEDICINE   Name: Edward [name] MRN: [id number] DOB: 11-29-1946    ADMISSION DATE:  12/26/2014 CONSULTATION DATE:  12/28/14  REFERRING MD :  [name]  CHIEF COMPLAINT:  Acute respiratory failure  INITIAL PRESENTATION: [name] is a 69yo man w/ PMHx of CVA with left-sided hemiparesis, Type 2 DM, CKD Stage 4/5 and HTN who came from [facility] after developing worsening AKI on CKD and respiratory distress. Patient then with minimal UOP and worsening AKI. HD was started 1/13 and ~2 L taken off. Patient then developed worsening respiratory distress with aspiration after eating. PCCM was consulted.   STUDIES:  CXR 1/11>> improving R basilar aeration with decreasing airspace opacity CXR 1/13>> Mild vascular congestion CXR 1/14>> bibasilar atelectasis and/or infiltrates  SIGNIFICANT EVENTS: 1/9>> Pt admitted at [facility] for weakness, peripheral edema 1/11>> Transferred to [facility] for worsening AKI on CKD, respiratory distress 1/13>> First HD session, ~2 L taken off 1/13>> Developed worsening respiratory distress, aspiration. PCCM consulted 1/15>> HD today  SUBJECTIVE: Intubated, sedated. Some agitation off of sedation. Follows commands.  VITAL SIGNS: Temp:  [95.4 F (35.2 C)-99.1 F (37.3 C)] 98.2 F (36.8 C) (01/15 1207) Pulse Rate:  [51-85] 85 (01/15 1200) Resp:  [14-20] 18 (01/15 1123) BP: (88-188)/(36-87) 161/62 mmHg (01/15 1123) SpO2:  [89 %-100 %] 89 % (01/15 1200) FiO2 (%):  [40 %] 40 % (01/15 0818) Weight:  [93.7 kg (206 lb 9.1 oz)-96.5 kg (212 lb 11.9 oz)] 93.7 kg (206 lb 9.1 oz) (01/15 1043) HEMODYNAMICS:   VENTILATOR SETTINGS: Vent Mode:  [-] PSV;CPAP FiO2 (%):  [40 %] 40 % Set Rate:  [14 bmp] 14 bmp Vt Set:  [550 mL] 550 mL PEEP:  [5 cmH20] 5 cmH20 Pressure Support:  [5 cmH20] 5 cmH20 Plateau Pressure:  [17 cmH20-18 cmH20] 17 cmH20 INTAKE / OUTPUT:  Intake/Output Summary (Last 24 hours) at 12/30/14  1449 Last data filed at 12/30/14 1400  Gross per 24 hour  Intake 958.85 ml  Output   6188 ml  Net -5229.15 ml    PHYSICAL EXAMINATION: General: some agitation off sedation Neuro:  Opens eyes to voice, moves R side spontaneously HEENT: [place]/AT, PERRL.  Cardiovascular: RRR, no m/g/r Lungs: CTA bilaterally, no wheezes Abdomen: BS+, obese, soft, non-tender Musculoskeletal: 2+ pitting edema in lower extremities Skin:  Warm, well perfused  LABS:  CBC  Recent Labs Lab 12/27/14 0349 12/29/14 0410 12/30/14 0244  WBC 11.7* 9.9 10.0  HGB 8.4* 8.1* 8.2*  HCT 25.6*  24.8* 23.9* 24.7*  PLT 161 129* 141*   Coag's No results for input(s): APTT, INR in the last 168 hours. BMET  Recent Labs Lab 12/29/14 1555 12/30/14 0244 12/30/14 0800  NA 138 137 142  K 4.5 4.7 4.8  CL 108 106 106  CO2 18* 19 20  BUN 85* 89* 89*  CREATININE 5.52* 5.60* 5.52*  GLUCOSE 109* 115* 112*   Electrolytes  Recent Labs Lab 12/27/14 0349 12/28/14 0535  12/29/14 1555 12/30/14 0244 12/30/14 0800  CALCIUM 6.3* 6.2*  < > 6.6* 6.7* 6.9*  PHOS 9.7* 9.4*  --   --   --  7.9*  < > = values in this interval not displayed. Sepsis Markers  Recent Labs Lab 12/26/14 2158  LATICACIDVEN 1.3   ABG  Recent Labs Lab 12/26/14 2106 12/28/14 2022  PHART 7.206* 7.241*  PCO2ART 30.9* 40.6  PO2ART 130.0* 433.0*   Liver Enzymes  Recent Labs Lab 12/26/14 2041 12/27/14 0349  12/28/14 0535 12/30/14 0800  AST 14  --   --   --   ALT 14  --   --   --   ALKPHOS 45  --   --   --   BILITOT 0.5  --   --   --   ALBUMIN 2.7* 2.4* 2.6* 2.3*   Cardiac Enzymes No results for input(s): TROPONINI, PROBNP in the last 168 hours. Glucose  Recent Labs Lab 12/29/14 1546 12/29/14 1933 12/30/14 0017 12/30/14 0410 12/30/14 0759 12/30/14 1206  GLUCAP 100* 95 120* 105* 106* 92    Imaging Portable Chest Xray  12/29/2014   CLINICAL DATA:  Respiratory failure.  EXAM: PORTABLE CHEST - 1 VIEW  COMPARISON:   12/28/2014.  FINDINGS: Interim placement of NGT. Its tip is below the left hemidiaphragm. Endotracheal tube and right IJ line in stable position. Low lung volumes with bibasilar atelectasis and/or infiltrates. No pleural effusion or pneumothorax. Stable cardiomegaly. Normal pulmonary vascularity.  IMPRESSION: 1. Interval placement NG tube, its tip is below the left hemidiaphragm. Endotracheal tube and right IJ line in stable position. 2. Low lung volumes with bibasilar atelectasis and/or infiltrates.   Electronically Signed   By: Marcello Moores  Register   On: 12/29/2014 07:29     ASSESSMENT / PLAN:  PULMONARY OETT 1/13>> A: Acute respiratory failure likely due to volume overload from ARF and aspiration  P:   SBT/WUA Extubate today  CARDIOVASCULAR CVL R IJ HD TLC 1/13 >> A:  Hx HTN  P:  Continue Atenolol 50 mg daily Continue ASA Continue statin  RENAL A:  ARF vs. CKD- likely acute on chronic. Started HD 1/13 Hyponatremia 2/2 fluid overload - resolved Metabolic acidosis - improving P:   Renal following, appreciate recommendations Continue HD per Renal Lasix Follow BMP  GASTROINTESTINAL A:  No acute issues P:   Continue Protonix TF Will resume diet later this evening - carb modified/renal   HEMATOLOGIC A:  Anemia, likely due to chronic disease. Received 2 units PRBCs at [facility]. - stable P:  Continue to monitor  INFECTIOUS A:  ?PNA- started on antibiotics on 1/11, no consolidation on CXR. Risk for aspiration PNA. - leukocytosis improved P:   BCx2 1/12>> NGTD Sputum 1/13>> No growth  Azithromycin 1/11>>1/13 Rocephin 1/11>>1/13 Zosyn 1/13>>1/15  ENDOCRINE A:  Type 2 DM Hypothyroidism P:   Sensitive ISS CBGs 4 times daily Continue Synthroid   NEUROLOGIC A: Sedated on vent Hx CVA with residual left-sided hemiparesis ?Acute metabolic encephalopathy 2/2 ARF P:   Continue to monitor mental status   FAMILY  - Updates: Roderic Ovens, and her husband updated at  bedside 1/14  - Inter-disciplinary family meet or Palliative Care meeting due by: 01/04/15  Jacques Earthly, MD  Pulmonary and [place] Pager: [phone]  12/30/2014, 2:49 PM  PCCM ATTENDING: I have reviewed pt's initial presentation, consultants notes and hospital database in detail.  The above assessment and plan was formulated under my direction.  He met all criteria for extubation this AM and our plan was to extubate after completion of HD. However, he self-extubated prior to that. Post extubation, he appears reasonably comfortable with a strong cough. He is able to follow commands briskly. The rest of his care plan as discussed on ICU rounds is accurately reflected in the note above. If he remains stable through night, he will likely be stable for transfer out of ICU tomorrow AM and back to the [facility] service. Renal Service's input is appreciated  Merton Border, MD;  PCCM service; [place] [phone]

## 2014-12-30 NOTE — Procedures (Signed)
I have seen and examined this patient and agree with the plan of care . Appears to be doing well on dialysis and hopefully will be extubated today [facility],Edward [name] 12/30/2014, 9:10 AM

## 2014-12-31 ENCOUNTER — Inpatient Hospital Stay (HOSPITAL_COMMUNITY): Payer: Medicare Other

## 2014-12-31 DIAGNOSIS — J9600 Acute respiratory failure, unspecified whether with hypoxia or hypercapnia: Secondary | ICD-10-CM

## 2014-12-31 DIAGNOSIS — I10 Essential (primary) hypertension: Secondary | ICD-10-CM

## 2014-12-31 LAB — GLUCOSE, CAPILLARY
GLUCOSE-CAPILLARY: 72 mg/dL (ref 70–99)
GLUCOSE-CAPILLARY: 98 mg/dL (ref 70–99)
GLUCOSE-CAPILLARY: 98 mg/dL (ref 70–99)
GLUCOSE-CAPILLARY: 73 mg/dL (ref 70–99)

## 2014-12-31 LAB — BASIC METABOLIC PANEL
ANION GAP: 13 (ref 5–15)
BUN: 57 mg/dL — ABNORMAL HIGH (ref 6–23)
CALCIUM: 7.3 mg/dL — AB (ref 8.4–10.5)
CHLORIDE: 108 meq/L (ref 96–112)
CO2: 22 mmol/L (ref 19–32)
Creatinine, Ser: 4.47 mg/dL — ABNORMAL HIGH (ref 0.50–1.35)
GFR calc Af Amer: 14 mL/min — ABNORMAL LOW (ref >90)
GFR calc non Af Amer: 12 mL/min — ABNORMAL LOW (ref >90)
GLUCOSE: 80 mg/dL (ref 70–99)
Potassium: 4.1 mmol/L (ref 3.5–5.1)
SODIUM: 143 mmol/L (ref 135–145)

## 2014-12-31 LAB — POCT I-STAT 3, ART BLOOD GAS (G3+)
ACID-BASE DEFICIT: 5 mmol/L — AB (ref 0.0–2.0)
BICARBONATE: 20.3 meq/L (ref 20.0–24.0)
O2 Saturation: 90 %
PH ART: 7.343 — AB (ref 7.350–7.450)
PO2 ART: 62 mmHg — AB (ref 80.0–100.0)
Patient temperature: 98.6
TCO2: 21 mmol/L (ref 0–100)
pCO2 arterial: 37.4 mmHg (ref 35.0–45.0)

## 2014-12-31 LAB — CBC
HCT: 25.1 % — ABNORMAL LOW (ref 39.0–52.0)
Hemoglobin: 8.3 g/dL — ABNORMAL LOW (ref 13.0–17.0)
MCH: 29.5 pg (ref 26.0–34.0)
MCHC: 33.1 g/dL (ref 30.0–36.0)
MCV: 89.3 fL (ref 78.0–100.0)
Platelets: 129 10*3/uL — ABNORMAL LOW (ref 150–400)
RBC: 2.81 MIL/uL — ABNORMAL LOW (ref 4.22–5.81)
RDW: 15.8 % — ABNORMAL HIGH (ref 11.5–15.5)
WBC: 11.3 10*3/uL — ABNORMAL HIGH (ref 4.0–10.5)

## 2014-12-31 MED ORDER — HALOPERIDOL LACTATE 5 MG/ML IJ SOLN
1.0000 mg | Freq: Once | INTRAMUSCULAR | Status: AC
  Administered 2014-12-31: 1 mg via INTRAVENOUS
  Filled 2014-12-31: qty 1

## 2014-12-31 MED ORDER — HALOPERIDOL LACTATE 5 MG/ML IJ SOLN: 1.0000 mg | Freq: Once | INTRAMUSCULAR | Status: DC

## 2014-12-31 NOTE — Progress Notes (Signed)
Pt refusing to wear BP cuff or let RN draw AM labs.  Will monitor and attempt to draw labs later.

## 2014-12-31 NOTE — Evaluation (Signed)
Clinical/Bedside Swallow Evaluation Patient Details  Name: Edward [name] MRN: [id number] Date of Birth: [birth year]  Today's Date: 12/31/2014 Time: 0907-0930 SLP Time Calculation (min) (ACUTE ONLY): 23 min  Past Medical History:  Past Medical History  Diagnosis Date  . Hypertension   . Hypothyroidism   . Diabetes mellitus without complication    Past Surgical History:  Past Surgical History  Procedure Laterality Date  . Chest tube placement     HPI:  [name] is a 69 yo man w/ PMHx of CVA with left-sided hemiparesis, Type 2 DM, CKD Stage 4/5 and HTN who [facility] after developing worsening AKI on CKD and respiratory distress. Patient then with minimal UOP and worsening AKI. HD was started 1/13 and ~2 L taken off. Patient then developed worsening respiratory distress with aspiration or nectar thick tomato juice on 1/13. Intubated from 1/13 to 1/15 with self extubation. Pt had MBS on 1/13 recommending Dys 3/nectar due to immediate aspiration of thin liquids with intermittent sensation. Fluctuating arousal during exam.    Assessment / Plan / Recommendation Clinical Impression  Pt demonstrates immediate coughing indicative of aspiration with thin liquids and questionable tolerance of purees (delayed cough). Given MBS on 1/13 indicating silent aspriation of thin liquids followed by aspriation event on recommended diet later that day, as well as probable worsening of sensory function following intubation, pt will need repeat MBS prior to starting POs. Will plan for assessment today at 1030.     Aspiration Risk  Severe    Diet Recommendation NPO   Postural Changes and/or Swallow Maneuvers: Seated upright 90 degrees    Other  Recommendations Recommended Consults: MBS Oral Care Recommendations: Oral care BID   Follow Up Recommendations  Skilled Nursing facility    Frequency and Duration        Pertinent Vitals/Pain NA    SLP Swallow Goals     Swallow  Study Prior Functional Status       General HPI: [name] is a 69 yo man w/ PMHx of CVA with left-sided hemiparesis, Type 2 DM, CKD Stage 4/5 and HTN who [facility] after developing worsening AKI on CKD and respiratory distress. Patient then with minimal UOP and worsening AKI. HD was started 1/13 and ~2 L taken off. Patient then developed worsening respiratory distress with aspiration or nectar thick tomato juice on 1/13. Intubated from 1/13 to 1/15 with self extubation. Pt had MBS on 1/13 recommending Dys 3/nectar due to immediate aspiration of thin liquids with intermittent sensation. Fluctuating arousal during exam.  Type of Study: Bedside swallow evaluation Previous Swallow Assessment: MBS 1/13 Diet Prior to this Study: NPO Temperature Spikes Noted: No History of Recent Intubation: Yes Length of Intubations (days): 3 days Date extubated: 12/30/14 Behavior/Cognition: Alert Oral Cavity - Dentition: Adequate natural dentition Self-Feeding Abilities: Able to feed self Patient Positioning: Upright in chair Baseline Vocal Quality: Hoarse;Low vocal intensity;Breathy Volitional Cough: Weak;Congested Volitional Swallow: Able to elicit    Oral/Motor/Sensory Function Overall Oral Motor/Sensory Function: Appears within functional limits for tasks assessed   Ice Chips Ice chips: Within functional limits   Thin Liquid Thin Liquid: Impaired Presentation: Cup Pharyngeal  Phase Impairments: Cough - Immediate    Nectar Thick Nectar Thick Liquid: Not tested   Honey Thick Honey Thick Liquid: Not tested   Puree Puree: Impaired Presentation: Spoon;Self Fed Pharyngeal Phase Impairments: Cough - Delayed   Solid   GO    Solid: Not tested  Harlon Ditty, MA CCC-SLP [phone]  Claudine Mouton 12/31/2014,9:35 AM

## 2014-12-31 NOTE — Progress Notes (Signed)
[facility] ROUNDING NOTE   Subjective:   Interval History: improved urine output and less shortness of breath. Awaiting swallowing study and frustrated at not being able to drink  Objective:  Vital signs in last 24 hours:  Temp:  [98.1 F (36.7 C)-99.1 F (37.3 C)] 98.1 F (36.7 C) (01/16 1142) Pulse Rate:  [71-85] 77 (01/16 0700) Resp:  [15-26] 15 (01/16 0700) BP: (134-186)/(53-99) 158/89 mmHg (01/16 0600) SpO2:  [88 %-100 %] 96 % (01/16 0700)  Weight change: 0.8 kg (1 lb 12.2 oz) Filed Weights   12/30/14 0500 12/30/14 0730 12/30/14 1043  Weight: 95.7 kg (210 lb 15.7 oz) 96.5 kg (212 lb 11.9 oz) 93.7 kg (206 lb 9.1 oz)    Intake/Output: I/O last 3 completed shifts: In: 993.4 [I.V.:485.4; NG/GT:222; IV Piggyback:286] Out: 8133 [Urine:5360; Other:2773]   Intake/Output this shift:  Total I/O In: -  Out: 1000 [Urine:1000]  CVS- RRR RS- CTA ABD- BS present obese EXT-2 + edema   Basic Metabolic Panel:  Recent Labs Lab 12/26/14 2041 12/27/14 0349 12/28/14 0535  12/29/14 1555 12/30/14 0244 12/30/14 0800 12/30/14 1639 12/31/14 0245  NA 137 138 131*  < > 138 137 142 139 143  K 5.2* 5.1 5.0  < > 4.5 4.7 4.8 4.1 4.1  CL 111 112 107  < > 108 106 106 105 108  CO2 11* 12* 11*  < > 18* GLUCOSE 293* 197* 155*  < > 109* 115* 112* 106* 80  BUN 91* 92* 107*  < > 85* 89* 89* 54* 57*  CREATININE 5.95* 5.93* 6.50*  < > 5.52* 5.60* 5.52* 4.19* 4.47*  CALCIUM 6.3* 6.3* 6.2*  < > 6.6* 6.7* 6.9* 7.4* 7.3*  PHOS 9.7* 9.7* 9.4*  --   --   --  7.9*  --   --   < > = values in this interval not displayed.  Liver Function Tests:  Recent Labs Lab 12/26/14 2041 12/27/14 0349 12/28/14 0535 12/30/14 0800  AST 14  --   --   --   ALT 14  --   --   --   ALKPHOS 45  --   --   --   BILITOT 0.5  --   --   --   PROT 5.5*  --   --   --   ALBUMIN 2.7* 2.4* 2.6* 2.3*   No results for input(s): LIPASE, AMYLASE in the last 168 hours. No results for  input(s): AMMONIA in the last 168 hours.  CBC:  Recent Labs Lab 12/26/14 2041 12/27/14 0349 12/29/14 0410 12/30/14 0244 12/31/14 0245  WBC 13.7* 11.7* 9.9 10.0 11.3*  NEUTROABS 12.9*  --   --   --   --   HGB 9.1* 8.4* 8.1* 8.2* 8.3*  HCT 28.0* 25.6*  24.8* 23.9* 24.7* 25.1*  MCV 87.8 90.1 90.9 87.9 89.3  PLT 186 161 129* 141* 129*    Cardiac Enzymes:  Recent Labs Lab 12/27/14 0349  CKTOTAL 620*    BNP: Invalid input(s): POCBNP  CBG:  Recent Labs Lab 12/30/14 0759 12/30/14 1206 12/30/14 1551 12/30/14 2238 12/31/14 0742  GLUCAP 106* 92 97 89 72    Microbiology: Results for orders placed or performed during the hospital encounter of 12/26/14  MRSA PCR Screening     Status: None   Collection Time: 12/26/14  7:57 PM  Result Value Ref Range Status   MRSA by PCR NEGATIVE NEGATIVE Final    Comment:  The GeneXpert MRSA Assay (FDA approved for NASAL specimens only), is one component of a comprehensive MRSA colonization surveillance program. It is not intended to diagnose MRSA infection nor to guide or monitor treatment for MRSA infections.   Culture, blood (routine x 2) Call MD if unable to obtain prior to antibiotics being given     Status: None (Preliminary result)   Collection Time: 12/27/14  5:30 AM  Result Value Ref Range Status   Specimen Description BLOOD RIGHT HAND  Final   Special Requests BOTTLES DRAWN AEROBIC ONLY 10CC  Final   Culture   Final           BLOOD CULTURE RECEIVED NO GROWTH TO DATE CULTURE WILL BE HELD FOR 5 DAYS BEFORE ISSUING A FINAL NEGATIVE REPORT Performed at [facility]    Report Status PENDING  Incomplete  Culture, blood (routine x 2) Call MD if unable to obtain prior to antibiotics being given     Status: None (Preliminary result)   Collection Time: 12/27/14  5:35 AM  Result Value Ref Range Status   Specimen Description BLOOD RIGHT ARM  Final   Special Requests BOTTLES DRAWN AEROBIC ONLY 10CC  Final    Culture   Final           BLOOD CULTURE RECEIVED NO GROWTH TO DATE CULTURE WILL BE HELD FOR 5 DAYS BEFORE ISSUING A FINAL NEGATIVE REPORT Performed at [facility]    Report Status PENDING  Incomplete  Culture, respiratory (NON-Expectorated)     Status: None (Preliminary result)   Collection Time: 12/28/14  8:11 PM  Result Value Ref Range Status   Specimen Description TRACHEAL ASPIRATE  Final   Special Requests NONE  Final   Gram Stain PENDING  Incomplete   Culture   Final    Culture reincubated for better growth Performed at [facility]    Report Status PENDING  Incomplete    Coagulation Studies: No results for input(s): LABPROT, INR in the last 72 hours.  Urinalysis:  Recent Labs  12/28/14 1948  COLORURINE YELLOW  LABSPEC 1.013  PHURINE 5.0  GLUCOSEU 100*  HGBUR SMALL*  BILIRUBINUR NEGATIVE  KETONESUR NEGATIVE  PROTEINUR >300*  UROBILINOGEN 0.2  NITRITE NEGATIVE  LEUKOCYTESUR NEGATIVE      Imaging: No results found.   Medications:     . aspirin EC  325 mg Oral QPM  . calcium acetate  1,334 mg Oral TID WC  . feeding supplement (PRO-STAT SUGAR FREE 64)  30 mL Per Tube QID  . furosemide  120 mg Intravenous Q8H  . haloperidol lactate  1 mg Intravenous Once  . heparin  5,000 Units Subcutaneous 3 times per day  . insulin aspart  0-5 Units Subcutaneous QHS  . insulin aspart  0-9 Units Subcutaneous TID WC  . levothyroxine  50 mcg Per Tube QAC breakfast  . multivitamin  1 tablet Oral QHS  . simvastatin  20 mg Per Tube QPM  . sodium bicarbonate  650 mg Oral BID  . sodium chloride  3 mL Intravenous Q12H   sodium chloride, sodium chloride, acetaminophen (TYLENOL) oral liquid 160 mg/5 mL, albuterol, heparin, hydrALAZINE, lidocaine (PF), lidocaine-prilocaine, [DISCONTINUED] ondansetron **OR** ondansetron (ZOFRAN) IV, pentafluoroprop-tetrafluoroeth  Assessment/ Plan:  [name] is a 69yo man w/ PMHx of CVA with left-sided hemiparesis, Type 2 DM, CKD  Stage 4/5 and HTN who [facility] after developing worsening AKI on CKD and respiratory distress. Patient then with minimal UOP and worsening  AKI. HD was started 1/13 and ~2 L taken off. Patient then developed worsening respiratory distress with aspiration after eating   ARF vs CKD This may be some progression of disease Creatinine has increased Last labs 18 months ago Cr 1.4   Hyerkalemia improved  Metabolic acidosis  resolved  Volume overload urine output better  It appears that the urine output is better and we may be able to avoid another dialysis treatment   Previous  Treatments 1/13 and 1/15   LOS: 5 Aadhira Heffernan W  :48 AM
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
## 2014-12-31 NOTE — Progress Notes (Signed)
PULMONARY / CRITICAL CARE MEDICINE   Name: Edward [name] MRN: [id number] DOB: [birth year]    ADMISSION DATE:  12/26/2014 CONSULTATION DATE:  12/28/14  REFERRING MD :  Edward [name] - [facility]  CHIEF COMPLAINT:  Acute respiratory failure  INITIAL PRESENTATION: Edward [name] is a 69yo man w/ PMHx of CVA with left-sided hemiparesis, Type 2 DM, CKD Stage 4/5 and HTN who came from [facility] after developing worsening AKI on CKD and respiratory distress. Patient then with minimal UOP and worsening AKI. HD was started 1/13 and ~2 L taken off. Patient then developed worsening respiratory distress with aspiration after eating. PCCM was consulted.   STUDIES:  CXR 1/11>> improving R basilar aeration with decreasing airspace opacity CXR 1/13>> Mild vascular congestion CXR 1/14>> bibasilar atelectasis and/or infiltrates  SIGNIFICANT EVENTS: 1/9>> Pt admitted at [facility] for weakness, peripheral edema 1/11>> Transferred to [facility] for worsening AKI on CKD, respiratory distress 1/13>> First HD session, ~2 L taken off 1/13>> Developed worsening respiratory distress, aspiration. PCCM consulted 1/15>> HD  1/16 transfer to floor and back to [facility] as of 1/17  SUBJECTIVE: More awake and follows commands  VITAL SIGNS: Temp:  [97.5 F (36.4 C)-99.1 F (37.3 C)] 98.6 F (37 C) (01/16 0353) Pulse Rate:  [64-85] 77 (01/16 0700) Resp:  [14-26] 15 (01/16 0700) BP: (88-188)/(45-99) 158/89 mmHg (01/16 0600) SpO2:  [88 %-100 %] 96 % (01/16 0700) FiO2 (%):  [40 %] 40 % (01/15 0818) Weight:  [206 lb 9.1 oz (93.7 kg)] 206 lb 9.1 oz (93.7 kg) (01/15 1043) HEMODYNAMICS:   VENTILATOR SETTINGS: Vent Mode:  [-] PSV;CPAP FiO2 (%):  [40 %] 40 % PEEP:  [5 cmH20] 5 cmH20 Pressure Support:  [5 cmH20] 5 cmH20 INTAKE / OUTPUT:  Intake/Output Summary (Last 24 hours) at 12/31/14 0756 Last data filed at 12/31/14 0700  Gross per 24 hour  Intake    243 ml  Output   6518 ml  Net  -6275 ml    PHYSICAL  EXAMINATION: General: some agitation off sedation Neuro:  Opens eyes to voice, moves R side spontaneously HEENT: Edward [name]/AT, PERRL.  Cardiovascular: RRR, no m/g/r Lungs: CTA bilaterally, no wheezes Abdomen: BS+, obese, soft, non-tender Musculoskeletal: 2+ pitting edema in lower extremities Skin:  Warm, well perfused  LABS:  CBC  Recent Labs Lab 12/29/14 0410 12/30/14 0244 12/31/14 0245  WBC 9.9 10.0 11.3*  HGB 8.1* 8.2* 8.3*  HCT 23.9* 24.7* 25.1*  PLT 129* 141* 129*   Coag's No results for input(s): APTT, INR in the last 168 hours. BMET  Recent Labs Lab 12/30/14 0800 12/30/14 1639 12/31/14 0245  NA 142 139 143  K 4.8 4.1 4.1  CL 106 105 108  CO2 BUN 89* 54* 57*  CREATININE 5.52* 4.19* 4.47*  GLUCOSE 112* 106* 80   Electrolytes  Recent Labs Lab 12/27/14 0349 12/28/14 0535  12/30/14 0800 12/30/14 1639 12/31/14 0245  CALCIUM 6.3* 6.2*  < > 6.9* 7.4* 7.3*  PHOS 9.7* 9.4*  --  7.9*  --   --   < > = values in this interval not displayed. Sepsis Markers  Recent Labs Lab 12/26/14 2158  LATICACIDVEN 1.3   ABG  Recent Labs Lab 12/26/14 2106 12/28/14 2022 12/31/14 0540  PHART 7.206* 7.241* 7.343*  PCO2ART 30.9* 40.6 37.4  PO2ART 130.0* 433.0* 62.0*   Liver Enzymes  Recent Labs Lab 12/26/14 2041 12/27/14 0349 12/28/14 0535 12/30/14 0800  AST 14  --   --   --  ALT 14  --   --   --   ALKPHOS 45  --   --   --   BILITOT 0.5  --   --   --   ALBUMIN 2.7* 2.4* 2.6* 2.3*   Cardiac Enzymes No results for input(s): TROPONINI, PROBNP in the last 168 hours. Glucose  Recent Labs Lab 12/30/14 0017 12/30/14 0410 12/30/14 0759 12/30/14 1206 12/30/14 1551 12/30/14 2238  GLUCAP 120* 105* 106* 92 97 89    Imaging No results found.   ASSESSMENT / PLAN:  PULMONARY OETT 1/13>>1/15 A: Acute respiratory failure likely due to volume overload from ARF and aspiration  P:   Extubated 1/15 O2 as needed  CARDIOVASCULAR CVL R IJ HD TLC  1/13 >> A:  Hx HTN  P:  Continue Atenolol 50 mg daily Continue ASA Continue statin  RENAL Lab Results  Component Value Date   CREATININE 4.47* 12/31/2014   CREATININE 4.19* 12/30/2014   CREATININE 5.52* 12/30/2014    Recent Labs Lab 12/30/14 0800 12/30/14 1639 12/31/14 0245  NA 142 139 143    A:  ARF vs. CKD- likely acute on chronic. Started HD 1/13 Hyponatremia 2/2 fluid overload - resolved Metabolic acidosis - improving P:   Renal following, appreciate recommendations Continue HD per Renal Lasix Follow BMP  GASTROINTESTINAL A:  No acute issues P:   Continue Protonix Diet  HEMATOLOGIC  Recent Labs  12/30/14 0244 12/31/14 0245  HGB 8.2* 8.3*    A:  Anemia, likely due to chronic disease. [facility] - stable P:  Continue to monitor  INFECTIOUS A:  ?PNA- started on antibiotics on 1/11, no consolidation on CXR. Risk for aspiration PNA. - leukocytosis improved P:   BCx2 1/12>> NGTD Sputum 1/13>> No growth  Azithromycin 1/11>>1/13 Rocephin 1/11>>1/13 Zosyn 1/13>>1/15  ENDOCRINE A:  Type 2 DM Hypothyroidism P:   Sensitive ISS CBGs 4 times daily Continue Synthroid   NEUROLOGIC A: Awake and cooperative.  Hx CVA with residual left-sided hemiparesis ?Acute metabolic encephalopathy 2/2 ARF Agitated at times.  Better 1/16 P:   Continue to monitor mental status Continue sitter  FAMILY  - Updates: Joretta BachelorCousin, Robin, and her husband updated at bedside 1/14  - Inter-disciplinary family meet or Palliative Care meeting due by: 01/04/15  Summary: Extubated 1/15, NAD, HD stable. We will transfer to floor and back to [facility] service. [facility] sent text message via AMION.  Brett CanalesSteve Minor ACNP [facility] PCCM Pager [phone] till 3 pm If no answer page [phone] 12/31/2014, 8:04 AM  Extubated.  Doing well.  Will transfer to the floor and back to [facility], PCCM will sign off, please call back if needed.  Patient seen and examined, agree with  above note.  I dictated the care and orders written for this patient under my direction.  Alyson ReedyWesam G Davette Nugent, MD [phone]

## 2014-12-31 NOTE — Progress Notes (Signed)
Pt still very agitated.  MD Rivet at bedside speaking with pt.  Pt attempting to get out of bed, refusing to wear nasal cannula.  Pt reoriented multiple times by RN and MD.  Will monitor pt.

## 2014-12-31 NOTE — Progress Notes (Signed)
Pt confused and intermittently agitated t/o night.  Pt demanding to get up to bedside commode every hour.  Pt gets SOB with activity.  Pt very weak with L hemiparesis.  Pt states that he understands why he shouldn't get out of bed every hour while attempting to get out of bed.  Pt reoriented repeatly by sitter, RN, and MD.  1mg  Haldol given for pt agitation.  Will monitor pt.

## 2014-12-31 NOTE — Procedures (Signed)
Objective Swallowing Evaluation: Modified Barium Swallowing Study  Patient Details  Name: Edward SonsKenneth L [name] MRN: [id number] Date of Birth: July 19, 1946  Today's Date: 12/31/2014 Time: SLP Start Time (ACUTE ONLY): 1330-SLP Stop Time (ACUTE ONLY): 1400 SLP Time Calculation (min) (ACUTE ONLY): 30 min  Past Medical History:  Past Medical History  Diagnosis Date  . Hypertension   . Hypothyroidism   . Diabetes mellitus without complication    Past Surgical History:  Past Surgical History  Procedure Laterality Date  . Chest tube placement     HPI:  HPI: [name] is a 69 yo man w/ PMHx of CVA with left-sided hemiparesis, Type 2 DM, CKD Stage 4/5 and HTN [facility] developing worsening AKI on CKD and respiratory distress. Patient then with minimal UOP and worsening AKI. HD was started 1/13 and ~2 L taken off. Patient then developed worsening respiratory distress with aspiration or nectar thick tomato juice on 1/13. Intubated from 1/13 to 1/15 with self extubation. Pt had MBS on 1/13 recommending Dys 3/nectar due to immediate aspiration of thin liquids with intermittent sensation. Fluctuating arousal during exam.   No Data Recorded  Assessment / Plan / Recommendation CHL IP CLINICAL IMPRESSIONS 12/31/2014  Dysphagia Diagnosis Severe pharyngeal phase dysphagia  Clinical impression Pt presents with worsened function since last MBS. Pt now with increased delay in swallow initiation and increased weakness. Pt aspirating nectar thick liquids before the swallow and honey thick liquids before/during the swallow due to delay and decreased airway closure. Even purees and solids pose a risk as pt has moderate oropharyngeal residuals and secretions that mix with residuals. Pt observed to aspirate puree residuals post swallow as it mixed with secretions. Sensation of aspirate is significantly delayed and pt never fully clears airway. Recommend short term alternate method of nutrition  allowing pt to recover further prior to starting a possible solids only diet. SLP will follow for readiness.       CHL IP TREATMENT RECOMMENDATION 12/31/2014  Treatment Plan Recommendations F/U MBS in --- days (Comment)     CHL IP DIET RECOMMENDATION 12/31/2014  Diet Recommendations Alternative means - temporary  Liquid Administration via (None)  Medication Administration Via alternative means  Compensations (None)  Postural Changes and/or Swallow Maneuvers (None)     CHL IP OTHER RECOMMENDATIONS 12/31/2014  Recommended Consults (None)  Oral Care Recommendations Oral care Q4 per protocol  Other Recommendations (None)     CHL IP FOLLOW UP RECOMMENDATIONS 12/31/2014  Follow up Recommendations [facility] D[facility]  Speech Therapy Frequency (ACUTE ONLY) min 3x week  Treatment Duration 2 weeks     Pertinent Vitals/Pain NA    SLP Swallow Goals No flowsheet data found.  No flowsheet data found.    CHL IP REASON FOR REFERRAL 12/28/2014  Reason for Referral Objectively evaluate swallowing function     CHL IP ORAL PHASE 12/31/2014  Lips (None)  Tongue (None)  Mucous membranes (None)  Nutritional status (None)  Other (None)  Oxygen therapy (None)  Oral Phase WFL  Oral - Pudding Teaspoon (None)  Oral - Pudding Cup (None)  Oral - Honey Teaspoon (None)  Oral - Honey Cup (None)  Oral - Honey Syringe (None)  Oral - Nectar Teaspoon (None)  Oral - Nectar Cup (None)  Oral - Nectar Straw (None)  Oral - Nectar Syringe (None)  Oral - Ice Chips (None)  Oral - Thin Teaspoon (None)  Oral - Thin Cup (  None)  Oral - Thin Straw (None)  Oral - Thin Syringe (None)  Oral - Puree (None)  Oral - Mechanical Soft (None)  Oral - Regular (None)  Oral - Multi-consistency (None)  Oral - Pill (None)  Oral Phase - Comment (None)      CHL IP PHARYNGEAL PHASE 12/31/2014  Pharyngeal Phase Impaired  Pharyngeal - Pudding Teaspoon (None)   Penetration/Aspiration details (pudding teaspoon) (None)  Pharyngeal - Pudding Cup (None)  Penetration/Aspiration details (pudding cup) (None)  Pharyngeal - Honey Teaspoon Delayed swallow initiation;Reduced airway/laryngeal closure;Reduced epiglottic inversion;Penetration/Aspiration before swallow;Penetration/Aspiration during swallow;Moderate aspiration;Pharyngeal residue - valleculae;Reduced tongue base retraction  Penetration/Aspiration details (honey teaspoon) Material enters airway, passes BELOW cords without attempt by patient to eject out (silent aspiration)  Pharyngeal - Honey Cup Delayed swallow initiation;Reduced airway/laryngeal closure;Reduced epiglottic inversion;Penetration/Aspiration before swallow;Penetration/Aspiration during swallow;Moderate aspiration;Pharyngeal residue - valleculae;Reduced tongue base retraction  Penetration/Aspiration details (honey cup) Material enters airway, passes BELOW cords without attempt by patient to eject out (silent aspiration)  Pharyngeal - Honey Syringe (None)  Penetration/Aspiration details (honey syringe) (None)  Pharyngeal - Nectar Teaspoon NT  Penetration/Aspiration details (nectar teaspoon) (None)  Pharyngeal - Nectar Cup Delayed swallow initiation;Reduced airway/laryngeal closure;Reduced epiglottic inversion;Penetration/Aspiration before swallow;Moderate aspiration;Pharyngeal residue - valleculae;Reduced tongue base retraction  Penetration/Aspiration details (nectar cup) Material enters airway, passes BELOW cords and not ejected out despite cough attempt by patient;Material enters airway, passes BELOW cords without attempt by patient to eject out (silent aspiration)  Pharyngeal - Nectar Straw (None)  Penetration/Aspiration details (nectar straw) (None)  Pharyngeal - Nectar Syringe (None)  Penetration/Aspiration details (nectar syringe) (None)  Pharyngeal - Ice Chips (None)  Penetration/Aspiration details (ice chips) (None)  Pharyngeal -  Thin Teaspoon (None)  Penetration/Aspiration details (thin teaspoon) (None)  Pharyngeal - Thin Cup NT  Penetration/Aspiration details (thin cup) (None)  Pharyngeal - Thin Straw NT  Penetration/Aspiration details (thin straw) (None)  Pharyngeal - Thin Syringe (None)  Penetration/Aspiration details (thin syringe') (None)  Pharyngeal - Puree Delayed swallow initiation;Reduced airway/laryngeal closure;Reduced epiglottic inversion;Penetration/Aspiration before swallow;Penetration/Aspiration during swallow;Moderate aspiration;Pharyngeal residue - valleculae  Penetration/Aspiration details (puree) Material enters airway, passes BELOW cords without attempt by patient to eject out (silent aspiration)  Pharyngeal - Mechanical Soft Pharyngeal residue - valleculae;Reduced tongue base retraction;Reduced epiglottic inversion;Delayed swallow initiation  Penetration/Aspiration details (mechanical soft) (None)  Pharyngeal - Regular NT  Penetration/Aspiration details (regular) (None)  Pharyngeal - Multi-consistency (None)  Penetration/Aspiration details (multi-consistency) (None)  Pharyngeal - Pill NT  Penetration/Aspiration details (pill) (None)  Pharyngeal Comment chin tuck attempted with puree and honey, did not improve function     No flowsheet data found.  No flowsheet data found.        Harlon Ditty, MA CCC-SLP [phone]  Claudine Mouton 12/31/2014, 2:12 PM

## 2015-01-01 LAB — CBC
HEMATOCRIT: 26.3 % — AB (ref 39.0–52.0)
Hemoglobin: 8.5 g/dL — ABNORMAL LOW (ref 13.0–17.0)
MCH: 29.2 pg (ref 26.0–34.0)
MCHC: 32.3 g/dL (ref 30.0–36.0)
MCV: 90.4 fL (ref 78.0–100.0)
Platelets: 149 10*3/uL — ABNORMAL LOW (ref 150–400)
RBC: 2.91 MIL/uL — ABNORMAL LOW (ref 4.22–5.81)
RDW: 15.8 % — AB (ref 11.5–15.5)
WBC: 12.6 10*3/uL — ABNORMAL HIGH (ref 4.0–10.5)

## 2015-01-01 LAB — BASIC METABOLIC PANEL
Anion gap: 17 — ABNORMAL HIGH (ref 5–15)
BUN: 60 mg/dL — ABNORMAL HIGH (ref 6–23)
CO2: 20 mmol/L (ref 19–32)
Calcium: 7.2 mg/dL — ABNORMAL LOW (ref 8.4–10.5)
Chloride: 109 mEq/L (ref 96–112)
Creatinine, Ser: 4.88 mg/dL — ABNORMAL HIGH (ref 0.50–1.35)
GFR calc non Af Amer: 11 mL/min — ABNORMAL LOW (ref >90)
GFR, EST AFRICAN AMERICAN: 13 mL/min — AB (ref >90)
GLUCOSE: 75 mg/dL (ref 70–99)
Potassium: 3.7 mmol/L (ref 3.5–5.1)
Sodium: 146 mmol/L — ABNORMAL HIGH (ref 135–145)

## 2015-01-01 LAB — GLUCOSE, CAPILLARY
GLUCOSE-CAPILLARY: 140 mg/dL — AB (ref 70–99)
GLUCOSE-CAPILLARY: 109 mg/dL — AB (ref 70–99)
GLUCOSE-CAPILLARY: 141 mg/dL — AB (ref 70–99)
Glucose-Capillary: 74 mg/dL (ref 70–99)
Glucose-Capillary: 68 mg/dL — ABNORMAL LOW (ref 70–99)
Glucose-Capillary: 145 mg/dL — ABNORMAL HIGH (ref 70–99)

## 2015-01-01 LAB — MAGNESIUM: Magnesium: 1.7 mg/dL (ref 1.5–2.5)

## 2015-01-01 LAB — CULTURE, RESPIRATORY

## 2015-01-01 LAB — CULTURE, RESPIRATORY W GRAM STAIN: Gram Stain: NONE SEEN

## 2015-01-01 LAB — PHOSPHORUS: Phosphorus: 6.4 mg/dL — ABNORMAL HIGH (ref 2.3–4.6)

## 2015-01-01 MED ORDER — HYDRALAZINE HCL 20 MG/ML IJ SOLN
10.0000 mg | Freq: Four times a day (QID) | INTRAMUSCULAR | Status: DC | PRN
  Administered 2015-01-01: 10 mg via INTRAVENOUS
  Filled 2015-01-01: qty 1

## 2015-01-01 MED ORDER — DEXTROSE 50 % IV SOLN
INTRAVENOUS | Status: AC
  Filled 2015-01-01: qty 50

## 2015-01-01 MED ORDER — RESOURCE THICKENUP CLEAR PO POWD
ORAL | Status: DC | PRN
  Filled 2015-01-01: qty 125

## 2015-01-01 NOTE — Progress Notes (Addendum)
Speech Language Pathology Treatment: Dysphagia  Patient Details Name: Edward SonsKenneth L Laningham MRN: [id number] DOB: [birth year] Today's Date: 01/01/2015 Time: [id number] SLP Time Calculation (min) (ACUTE ONLY): 29 min  Assessment / Plan / Recommendation Clinical Impression  Pt seen in chair in am, SLP explained rationale for decisions and pt verbalized willingness to participate. Following oral care, provided trials of pudding/puree with max verbal cues needed for compliance with chin tuck. Pt understands instructions, but appears somewhat resistant to following directions completely. Minimal throat clearing observed following these trials, progressed to solids with similar result. Given overall tolerance of purees and solids, recommend initiating a Dys 3/pudding thick diet (no liquids) to progress pt into PO consumption. Will also allow a few ice chips following oral care in between meals. Will follow closely for tolerance. MD please consider initiating PT eval. Could pt be a candidate for CIR?   HPI HPI: [name] is a 69 yo man w/ PMHx of CVA with left-sided hemiparesis, Type 2 DM, CKD Stage 4/5 and HTN [facility] developing worsening AKI on CKD and respiratory distress. Patient then with minimal UOP and worsening AKI. HD was started 1/13 and ~2 L taken off. Patient then developed worsening respiratory distress with aspiration or nectar thick tomato juice on 1/13. Intubated from 1/13 to 1/15 with self extubation. Pt had MBS on 1/13 recommending Dys 3/nectar due to immediate aspiration of thin liquids with intermittent sensation. Fluctuating arousal during exam.    Pertinent Vitals Pain Assessment: No/denies pain  SLP Plan  Continue with current plan of care    Recommendations Diet recommendations: Pudding-thick liquid;Dysphagia 3 (mechanical soft) Medication Administration: Crushed with puree Supervision: Full supervision/cueing for compensatory strategies Compensations: Slow  rate;Small sips/bites;Clear throat intermittently Postural Changes and/or Swallow Maneuvers: Seated upright 90 degrees;Chin tuck;Out of bed for meals              Oral Care Recommendations: Oral care Q4 per protocol Follow up Recommendations: Inpatient Rehab Plan: Continue with current plan of care    GO    [facility] HospitalBonnie Osbaldo Mark, MA CCC-SLP [phone]  Claudine MoutonDeBlois, Talbert Trembath Caroline 01/01/2015, 9:00 AM

## 2015-01-01 NOTE — Progress Notes (Signed)
Patient ID: Edward [name]  male  ZOX:[id number]    DOB: 1946-04-01    DOA: 12/26/2014  PCP: Kendell Bane, MD   Brief history of present illness  Patient is a 69 year old male with prior CVA with left-sided hemiparesis, type 2 diabetes, hypertension, hypothyroidism was taken to [facility] 2 days ago after patient was having increasing weakness with lower extremities edema. Patient was found to have elevated creatinine, low hemoglobin. Chest x-ray showed possible pneumonia. Patient also received 2 units of packed RBCs at [facility] for the anemia. His respiratory status however continued to worsen and was given Lasix and transferred to [facility] for further management. 2-D echo at [facility] showed EF 55-60% with moderate pulmonary hypertension and LVH. Renal ultrasound was normal. Hemoglobin on admission 7.6, creatinine 6.1 with bicarbonate of 13, ABG showed pH of 7.1. Patient's home medication listed that patient was on ACE inhibitor otherwise he was not aware of having kidney problems On 1/13, patient was noticed to be in respiratory distress with rhonchorous lungs, nephrology service had been following, hemodialysis was initiated on 1/13 and 2 L were taken off. However in the evening, patient went into respiratory distress after aspirating on the supper. Critical care was consulted and patient was transferred to ICU and intubated. Patient had repeat hemodialysis on 1/15, transferred back to the floor, [facility] assumed Care on 1/17  Assessment/Plan: Principal Problem:   Acute respiratory failure with aspiration pneumonia, likely due to volume overload from acute renal failure and aspiration - Patient was extubated on 1/15, currently stable, continue O2 as needed - Followed closely by speech therapy, restarted on dysphagia 3 diet today - Leukocytosis slightly up, follow closely, was on steroids, discontinued. If febrile, may need antibiotics.  Active Problems: Acute renal  failure on CKD - Hemodialysis initiated on 1/13, renal service following    Essential hypertension: Uncontrolled - Continue IV Lasix, IV hydralazine as needed    Anemia in chronic kidney disease -Hemoglobin currently stable     Diabetes mellitus type 2, uncontrolled -Continue sliding scale insulin     Hypothyroidism - Continue Synthroid    History of stroke with left-sided hemiparesis - Continueaspirin,  statin  Acute encephalopathy - Likely metabolic encephalopathy secondary to acute renal failure, agitated at times, currently stable.  DVT Prophylaxis:  Code Status:  Family Communication:  Disposition:  Consultants:  Critical care    nephrology  Procedures:  Hemodialysis   Antibiotics:  None    Subjective: Patient seen and examined, sitting up in the chair, somewhat frustrated, was awaiting for speech evaluation   Objective: Weight change: -11.859 kg (-26 lb 2.3 oz)  Intake/Output Summary (Last 24 hours) at 01/01/15 1032 Last data filed at 01/01/15 8119  Gross per 24 hour  Intake      0 ml  Output   2700 ml  Net  -2700 ml   Blood pressure 190/74, pulse 79, temperature 98.3 F (36.8 C), temperature source Oral, resp. rate 17, height  (1.727 m), weight 84.641 kg (186 lb 9.6 oz), SpO2 97 %.  Physical Exam: General: Alert and awake, oriented , not in any acute distress. CVS: S1-S2 clear, no murmur rubs or gallops Chest: Decreased breath sounds at the bases  Abdomen: soft nontender, nondistended, normal bowel sounds  Extremities: no cyanosis, clubbing. 2+ edema noted bilaterally   Lab Results: Basic Metabolic Panel:  Recent Labs Lab 12/30/14 0800 12/30/14 1639 12/31/14 0245  NA 142 139 143  K 4.8 4.1 4.1  CL  106 105 108  CO2 GLUCOSE 112* 106* 80  BUN 89* 54* 57*  CREATININE 5.52* 4.19* 4.47*  CALCIUM 6.9* 7.4* 7.3*  PHOS 7.9*  --   --    Liver Function Tests:  Recent Labs Lab 12/26/14 2041  12/28/14 0535  12/30/14 0800  AST 14  --   --   --   ALT 14  --   --   --   ALKPHOS 45  --   --   --   BILITOT 0.5  --   --   --   PROT 5.5*  --   --   --   ALBUMIN 2.7*  < > 2.6* 2.3*  < > = values in this interval not displayed. No results for input(s): LIPASE, AMYLASE in the last 168 hours. No results for input(s): AMMONIA in the last 168 hours. CBC:  Recent Labs Lab 12/26/14 2041  12/31/14 0245 01/01/15 0610  WBC 13.7*  < > 11.3* 12.6*  NEUTROABS 12.9*  --   --   --   HGB 9.1*  < > 8.3* 8.5*  HCT 28.0*  < > 25.1* 26.3*  MCV 87.8  < > 89.3 90.4  PLT 186  < > 129* 149*  < > = values in this interval not displayed. Cardiac Enzymes:  Recent Labs Lab 12/27/14 0349  CKTOTAL 620*   BNP: Invalid input(s): POCBNP CBG:  Recent Labs Lab 12/31/14 1714 12/31/14 2139 01/01/15 0607 01/01/15 0803 01/01/15 1028  GLUCAP 98 73 74 68* 140*     Micro Results: Recent Results (from the past 240 hour(s))  MRSA PCR Screening     Status: None   Collection Time: 12/26/14  7:57 PM  Result Value Ref Range Status   MRSA by PCR NEGATIVE NEGATIVE Final    Comment:        The GeneXpert MRSA Assay (FDA approved for NASAL specimens only), is one component of a comprehensive MRSA colonization surveillance program. It is not intended to diagnose MRSA infection nor to guide or monitor treatment for MRSA infections.   Culture, blood (routine x 2) Call MD if unable to obtain prior to antibiotics being given     Status: None (Preliminary result)   Collection Time: 12/27/14  5:30 AM  Result Value Ref Range Status   Specimen Description BLOOD RIGHT HAND  Final   Special Requests BOTTLES DRAWN AEROBIC ONLY 10CC  Final   Culture   Final           BLOOD CULTURE RECEIVED NO GROWTH TO DATE CULTURE WILL BE HELD FOR 5 DAYS BEFORE ISSUING A FINAL NEGATIVE REPORT Performed at [facility]    Report Status PENDING  Incomplete  Culture, blood (routine x 2) Call MD if unable to obtain prior to  antibiotics being given     Status: None (Preliminary result)   Collection Time: 12/27/14  5:35 AM  Result Value Ref Range Status   Specimen Description BLOOD RIGHT ARM  Final   Special Requests BOTTLES DRAWN AEROBIC ONLY 10CC  Final   Culture   Final           BLOOD CULTURE RECEIVED NO GROWTH TO DATE CULTURE WILL BE HELD FOR 5 DAYS BEFORE ISSUING A FINAL NEGATIVE REPORT Performed at [facility]    Report Status PENDING  Incomplete  Culture, respiratory (NON-Expectorated)     Status: None (Preliminary result)   Collection Time: 12/28/14  8:11 PM  Result Value  Ref Range Status   Specimen Description TRACHEAL ASPIRATE  Final   Special Requests NONE  Final   Gram Stain PENDING  Incomplete   Culture   Final    Culture reincubated for better growth Performed at [facility]    Report Status PENDING  Incomplete    Studies/Results: Portable Chest Xray  12/29/2014   CLINICAL DATA:  Respiratory failure.  EXAM: PORTABLE CHEST - 1 VIEW  COMPARISON:  12/28/2014.  FINDINGS: Interim placement of NGT. Its tip is below the left hemidiaphragm. Endotracheal tube and right IJ line in stable position. Low lung volumes with bibasilar atelectasis and/or infiltrates. No pleural effusion or pneumothorax. Stable cardiomegaly. Normal pulmonary vascularity.  IMPRESSION: 1. Interval placement NG tube, its tip is below the left hemidiaphragm. Endotracheal tube and right IJ line in stable position. 2. Low lung volumes with bibasilar atelectasis and/or infiltrates.   Electronically Signed   By: Maisie Fus  Register   On: 12/29/2014 07:29   Dg Chest Port 1 View  12/28/2014   CLINICAL DATA:  Intubation, respiratory failure  EXAM: PORTABLE CHEST - 1 VIEW  COMPARISON:  12/28/2014  FINDINGS: Endotracheal tube 5 cm above the carina. Right IJ dialysis catheter tip SVC RA junction level. Low lung volumes noted with mild vascular congestion and basilar atelectasis. Normal heart size. No effusion or pneumothorax.  Stable exam.  IMPRESSION: Endotracheal tube 5 cm above the carina.  Stable low lung volumes and vascular congestion   Electronically Signed   By: Ruel Favors M.D.   On: 12/28/2014 19:58   Dg Chest Port 1 View  12/28/2014   CLINICAL DATA:  Central line placement.  EXAM: PORTABLE CHEST - 1 VIEW  COMPARISON:  Single view of the chest 12/26/2014.  FINDINGS: Right IJ catheter is in place with the tip just within the right atrium. The tube could be withdrawn 2 cm for positioning at the superior cavoatrial junction. Lung volumes are low with right basilar airspace disease again seen. Heart size is normal. There is no pneumothorax. Right pleural effusion is noted.  IMPRESSION: Right IJ catheter tip projects within the right atrium. The line could be withdrawn 2 cm for positioning of the superior cavoatrial junction. Negative for pneumothorax.  No change in right effusion and basilar airspace disease.   Electronically Signed   By: Drusilla Kanner M.D.   On: 12/28/2014 13:41   Dg Chest Port 1 View  12/26/2014   CLINICAL DATA:  Fluid overload.  Shortness of breath.  EXAM: PORTABLE CHEST - 1 VIEW  COMPARISON:  Earlier the same day at 05:12 hr  FINDINGS: Portable AP view at 1956 hr: Low lung volumes persist. There is improving right basilar aeration with decreasing right basilar airspace disease. Unchanged left basilar airspace disease. Pulmonary vascular congestion, unchanged from prior. Cardiomediastinal contours are unchanged. Question small bilateral pleural effusions.  IMPRESSION: Improving right basilar aeration with decreasing airspace opacity. Question small bilateral pleural effusions.   Electronically Signed   By: Rubye Oaks M.D.   On: 12/26/2014 20:37   Dg Abd Portable 1v  12/29/2014   CLINICAL DATA:  Orogastric tube placement.  Initial encounter.  EXAM: PORTABLE ABDOMEN - 1 VIEW  COMPARISON:  None.  FINDINGS: The patient's enteric tube is noted coiling at the body of the stomach and ending overlying  the fundus of the stomach.  The visualized bowel gas pattern is grossly unremarkable. Contrast is seen filling the cecum, ascending colon and base of the appendix. Mild right basilar airspace opacity  may reflect atelectasis. No acute osseous abnormalities are seen.  IMPRESSION: 1. Enteric tube noted coiling at the body of the stomach and ending overlying the fundus of the stomach. 2. Mild right basilar airspace opacity may reflect atelectasis.   Electronically Signed   By: Roanna Raider M.D.   On: 12/29/2014 02:38   Dg Swallowing Func-speech Pathology  12/31/2014   Riley Nearing Deblois, CCC-SLP     12/31/2014  2:15 PM  Objective Swallowing Evaluation: Modified Barium Swallowing Study   Patient Details  Name: Edward [name] MRN: [id number] Date of Birth: Mar 02, 1946  Today's Date: 12/31/2014 Time: SLP Start Time (ACUTE ONLY): 1330-SLP Stop Time (ACUTE  ONLY): 1400 SLP Time Calculation (min) (ACUTE ONLY): 30 min  Past Medical History:  Past Medical History  Diagnosis Date  . Hypertension   . Hypothyroidism   . Diabetes mellitus without complication    Past Surgical History:  Past Surgical History  Procedure Laterality Date  . Chest tube placement     HPI:  HPI: Mr. Grassia is a 69 yo man w/ PMHx of CVA with left-sided  hemiparesis, Type 2 DM, CKD Stage 4/5 and HTN who came from  [facility] after developing worsening AKI on CKD and  respiratory distress. Patient then with minimal UOP and worsening  AKI. HD was started 1/13 and ~2 L taken off. Patient then  developed worsening respiratory distress with aspiration or  nectar thick tomato juice on 1/13. Intubated from 1/13 to 1/15  with self extubation. Pt had MBS on 1/13 recommending Dys  3/nectar due to immediate aspiration of thin liquids with  intermittent sensation. Fluctuating arousal during exam.   No Data Recorded  Assessment / Plan / Recommendation CHL IP CLINICAL IMPRESSIONS 12/31/2014  Dysphagia Diagnosis Severe pharyngeal phase dysphagia  Clinical  impression Pt presents with worsened function since last  MBS. Pt now with increased delay in swallow initiation and  increased weakness. Pt aspirating nectar thick liquids before the  swallow and honey thick liquids before/during the swallow due to  delay and decreased airway closure. Even purees and solids pose a  risk as pt has moderate oropharyngeal residuals and secretions  that mix with residuals. Pt observed to aspirate puree residuals  post swallow as it mixed with secretions. Sensation of aspirate  is significantly delayed and pt never fully clears airway.  Recommend short term alternate method of nutrition allowing pt to  recover further prior to starting a possible solids only diet.  SLP will follow for readiness.       CHL IP TREATMENT RECOMMENDATION 12/31/2014  Treatment Plan Recommendations F/U MBS in --- days (Comment)     CHL IP DIET RECOMMENDATION 12/31/2014  Diet Recommendations Alternative means - temporary  Liquid Administration via (None)  Medication Administration Via alternative means Compensations  (None)  Postural Changes and/or Swallow Maneuvers (None)     CHL IP OTHER RECOMMENDATIONS 12/31/2014  Recommended Consults (None)  Oral Care Recommendations Oral care Q4 per protocol  Other Recommendations (None)     CHL IP FOLLOW UP RECOMMENDATIONS 12/31/2014  Follow up Recommendations Skilled Nursing facility     [facility] IP FREQUENCY AND DURATION 12/31/2014  Speech Therapy Frequency (ACUTE ONLY) min 3x week  Treatment Duration 2 weeks     Pertinent Vitals/Pain NA    SLP Swallow Goals No flowsheet data found.  No flowsheet data found.    CHL IP REASON FOR REFERRAL 12/28/2014 Reason for Referral  Objectively evaluate swallowing function     CHL  IP ORAL PHASE 12/31/2014  Lips (None)  Tongue (None)  Mucous membranes (None)  Nutritional status (None)  Other (None)  Oxygen therapy (None)  Oral Phase WFL  Oral - Pudding Teaspoon (None)  Oral - Pudding Cup (None)  Oral - Honey Teaspoon (None)  Oral - Honey Cup  (None)  Oral - Honey Syringe (None)  Oral - Nectar Teaspoon (None)  Oral - Nectar Cup (None)  Oral - Nectar Straw (None)  Oral - Nectar Syringe (None)  Oral - Ice Chips (None)  Oral - Thin Teaspoon (None)  Oral - Thin Cup (None)  Oral - Thin Straw (None)  Oral - Thin Syringe (None)  Oral - Puree (None)  Oral - Mechanical Soft (None)  Oral - Regular (None)  Oral - Multi-consistency (None)  Oral - Pill (None)  Oral Phase - Comment (None)      CHL IP PHARYNGEAL PHASE 12/31/2014  Pharyngeal Phase Impaired  Pharyngeal - Pudding Teaspoon (None)  Penetration/Aspiration details (pudding teaspoon) (None)  Pharyngeal - Pudding Cup (None)  Penetration/Aspiration details (pudding cup) (None)  Pharyngeal - Honey Teaspoon Delayed swallow initiation;Reduced  airway/laryngeal closure;Reduced epiglottic  inversion;Penetration/Aspiration before  swallow;Penetration/Aspiration during swallow;Moderate  aspiration;Pharyngeal residue - valleculae;Reduced tongue base  retraction  Penetration/Aspiration details (honey teaspoon) Material enters  airway, passes BELOW cords without attempt by patient to eject  out (silent aspiration)  Pharyngeal - Honey Cup Delayed swallow initiation;Reduced  airway/laryngeal closure;Reduced epiglottic  inversion;Penetration/Aspiration before  swallow;Penetration/Aspiration during swallow;Moderate  aspiration;Pharyngeal residue - valleculae;Reduced tongue base  retraction  Penetration/Aspiration details (honey cup) Material enters  airway, passes BELOW cords without attempt by patient to eject  out (silent aspiration)  Pharyngeal - Honey Syringe (None)  Penetration/Aspiration details (honey syringe) (None)  Pharyngeal - Nectar Teaspoon NT  Penetration/Aspiration details (nectar teaspoon) (None)  Pharyngeal - Nectar Cup Delayed swallow initiation;Reduced  airway/laryngeal closure;Reduced epiglottic  inversion;Penetration/Aspiration before swallow;Moderate  aspiration;Pharyngeal residue - valleculae;Reduced  tongue base  retraction  Penetration/Aspiration details (nectar cup) Material enters  airway, passes BELOW cords and not ejected out despite cough  attempt by patient;Material enters airway, passes BELOW cords  without attempt by patient to eject out (silent aspiration)  Pharyngeal - Nectar Straw (None)  Penetration/Aspiration details (nectar straw) (None)  Pharyngeal - Nectar Syringe (None)  Penetration/Aspiration details (nectar syringe) (None)  Pharyngeal - Ice Chips (None)  Penetration/Aspiration details (ice chips) (None)  Pharyngeal - Thin Teaspoon (None)  Penetration/Aspiration details (thin teaspoon) (None)  Pharyngeal - Thin Cup NT  Penetration/Aspiration details (thin cup) (None)  Pharyngeal - Thin Straw NT  Penetration/Aspiration details (thin straw) (None)  Pharyngeal - Thin Syringe (None)  Penetration/Aspiration details (thin syringe') (None)  Pharyngeal - Puree Delayed swallow initiation;Reduced  airway/laryngeal closure;Reduced epiglottic  inversion;Penetration/Aspiration before  swallow;Penetration/Aspiration during swallow;Moderate  aspiration;Pharyngeal residue - valleculae  Penetration/Aspiration details (puree) Material enters airway,  passes BELOW cords without attempt by patient to eject out  (silent aspiration)  Pharyngeal - Mechanical Soft Pharyngeal residue -  valleculae;Reduced tongue base retraction;Reduced epiglottic  inversion;Delayed swallow initiation  Penetration/Aspiration details (mechanical soft) (None)  Pharyngeal - Regular NT  Penetration/Aspiration details (regular) (None)  Pharyngeal - Multi-consistency (None)  Penetration/Aspiration details (multi-consistency) (None)  Pharyngeal - Pill NT Penetration/Aspiration details (pill) (None)   Pharyngeal Comment chin tuck attempted with puree and honey, did  not improve function     No flowsheet data found.  No flowsheet data found.        Harlon Ditty, MA CCC-SLP [phone]  Claudine Mouton 12/31/2014, 2:12 PM  Dg  Swallowing Func-speech Pathology  12/28/2014   Carolan ShiverAmanda Laurice Couture, CCC-SLP     12/28/2014  2:04 PM Objective Swallowing Evaluation: Modified Barium Swallowing Study   Patient Details  Name: Edward [name] MRN: [id number] Date of Birth: [birth year]  Today's Date: 12/28/2014 Time: 1100-1130 SLP Time Calculation (min) (ACUTE ONLY): 30 min  Past Medical History:  Past Medical History  Diagnosis Date  . Hypertension   . Hypothyroidism   . Diabetes mellitus without complication    Past Surgical History:  Past Surgical History  Procedure Laterality Date  . Chest tube placement     HPI:  Pt is 69 y.o. 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  He reports  swallowing difficulty immediately post-stroke, but not in recent  years.  Poor performance during clinical swallow eval with  concerns for aspiration, so MBS recommended.        Assessment / Plan / Recommendation Clinical Impression  Dysphagia Diagnosis: Mild-mod pharyngeal phase dysphagia  Clinical impression: Pt presents with a mild-mod pharyngeal  dysphagia marked by premature spillage of POs into pharynx,  immediate aspiration of thin liquids (not consistently  accompanied by a cough), and mild vallecular residue of solids  post-swallow.  Pt with fluctuating alertness during study,  requiring frequent prompts to remain awake.  Compensatory  techniques were not effective due to mentation.  Recommend  initiating a dysphagia 3 diet with nectar-thick liquids; meds  crushed with puree.  SLP to follow for diet toleration and  advancement.     Treatment Recommendation  Therapy as outlined in treatment plan below    Diet Recommendation Dysphagia 3 (Mechanical Soft);Nectar-thick  liquid   Liquid Administration via: Cup Medication Administration: Crushed with puree Supervision: Full supervision/cueing for compensatory strategies Compensations: Slow rate;Small sips/bites (hold tray if coughing) Postural Changes and/or  Swallow Maneuvers: Seated upright 90  degrees    Other  Recommendations Oral Care Recommendations: Oral care BID   Follow Up Recommendations   (tba)    Frequency and Duration min 3x week  2 weeks        SLP Swallow Goals     General Date of Onset: 12/26/14     Type of Study: Modified Barium Swallowing Study Reason for Referral: Objectively evaluate swallowing function Previous Swallow Assessment: clinical swallow evaluation 12/28/14 Diet Prior to this Study: NPO Temperature Spikes Noted: No Respiratory Status: Room air History of Recent Intubation: No Behavior/Cognition: Lethargic Oral Cavity - Dentition: Adequate natural dentition Self-Feeding Abilities: Able to feed self Patient Positioning: Upright in chair Baseline Vocal Quality: Wet Volitional Cough: Weak;Congested Volitional Swallow: Able to elicit Anatomy:  (presence of likely osteophytes C4-5 - no radiologist  present to confirm) Pharyngeal Secretions: Not observed secondary MBS    Reason for Referral Objectively evaluate swallowing function   Oral Phase Oral Preparation/Oral Phase Oral Phase: WFL   Pharyngeal Phase Pharyngeal Phase Pharyngeal Phase: Impaired Pharyngeal - Nectar Pharyngeal - Nectar Cup: Premature spillage to valleculae;Reduced  tongue base retraction Pharyngeal - Thin Pharyngeal - Thin Cup: Premature spillage to pyriform  sinuses;Reduced airway/laryngeal closure;Penetration/Aspiration  during swallow;Penetration/Aspiration after swallow;Trace  aspiration Penetration/Aspiration details (thin cup): Material enters  airway, passes BELOW cords without attempt by patient to eject  out (silent aspiration);Material enters airway, passes BELOW  cords and not ejected out despite cough attempt by patient Pharyngeal - Thin Straw: Premature spillage to pyriform  sinuses;Reduced airway/laryngeal closure;Penetration/Aspiration  during swallow;Penetration/Aspiration after swallow;Trace  aspiration Penetration/Aspiration  details (thin straw): Material enters   airway, passes BELOW cords and not ejected out despite cough  attempt by patient;Material enters airway, passes BELOW cords  without attempt by patient to eject out (silent aspiration) Pharyngeal - Solids Pharyngeal - Puree: Premature spillage to valleculae;Reduced  tongue base retraction;Pharyngeal residue - valleculae Pharyngeal - Regular: Premature spillage to valleculae;Reduced  tongue base retraction;Pharyngeal residue - valleculae     Amanda L. Samson Frederic, [place] CCC/SLP Pager [phone]               Blenda Mounts Laurice 12/28/2014, 2:03 PM     Medications: Scheduled Meds: . aspirin EC  325 mg Oral QPM  . calcium acetate  1,334 mg Oral TID WC  . dextrose      . feeding supplement (PRO-STAT SUGAR FREE 64)  30 mL Per Tube QID  . furosemide  120 mg Intravenous Q8H  . haloperidol lactate  1 mg Intravenous Once  . heparin  5,000 Units Subcutaneous 3 times per day  . insulin aspart  0-5 Units Subcutaneous QHS  . insulin aspart  0-9 Units Subcutaneous TID WC  . levothyroxine  50 mcg Per Tube QAC breakfast  . multivitamin  1 tablet Oral QHS  . simvastatin  20 mg Per Tube QPM  . sodium bicarbonate  650 mg Oral BID  . sodium chloride  3 mL Intravenous Q12H   Time spent: 25 mins   LOS: 6 days   Shaylen Nephew M.D. [facility] 01/01/2015, 10:32 AM Pager: [phone]  If 7PM-7AM, please contact night-coverage [email or web address] Password [email or web address]

## 2015-01-01 NOTE — Progress Notes (Signed)
[facility] ROUNDING NOTE   Subjective:   Interval History: diuresing well this morning   Objective:  Vital signs in last 24 hours:  Temp:  [98.1 F (36.7 C)-98.3 F (36.8 C)] 98.3 F (36.8 C) (01/17 0805) Pulse Rate:  [67-83] 79 (01/17 0805) Resp:  [13-17] 17 (01/17 0805) BP: (136-195)/(55-75) 190/74 mmHg (01/17 0805) SpO2:  [95 %-98 %] 97 % (01/17 0805) Weight:  [84.641 kg (186 lb 9.6 oz)] 84.641 kg (186 lb 9.6 oz) (01/17 0400)  Weight change: -11.859 kg (-26 lb 2.3 oz) Filed Weights   12/30/14 0730 12/30/14 1043 01/01/15 0400  Weight: 96.5 kg (212 lb 11.9 oz) 93.7 kg (206 lb 9.1 oz) 84.641 kg (186 lb 9.6 oz)    Intake/Output: I/O last 3 completed shifts: In: 103 [I.V.:103] Out: 5225 [Urine:5225]   Intake/Output this shift:     CVS- RRR RS- CTA ABD- BS present soft non-distended EXT-  2 +  edema   Basic Metabolic Panel:  Recent Labs Lab 12/26/14 2041 12/27/14 0349 12/28/14 0535  12/29/14 1555 12/30/14 0244 12/30/14 0800 12/30/14 1639 12/31/14 0245  NA 137 138 131*  < > 138 137 142 139 143  K 5.2* 5.1 5.0  < > 4.5 4.7 4.8 4.1 4.1  CL 111 112 107  < > 108 106 106 105 108  CO2 11* 12* 11*  < > 18* GLUCOSE 293* 197* 155*  < > 109* 115* 112* 106* 80  BUN 91* 92* 107*  < > 85* 89* 89* 54* 57*  CREATININE 5.95* 5.93* 6.50*  < > 5.52* 5.60* 5.52* 4.19* 4.47*  CALCIUM 6.3* 6.3* 6.2*  < > 6.6* 6.7* 6.9* 7.4* 7.3*  PHOS 9.7* 9.7* 9.4*  --   --   --  7.9*  --   --   < > = values in this interval not displayed.  Liver Function Tests:  Recent Labs Lab 12/26/14 2041 12/27/14 0349 12/28/14 0535 12/30/14 0800  AST 14  --   --   --   ALT 14  --   --   --   ALKPHOS 45  --   --   --   BILITOT 0.5  --   --   --   PROT 5.5*  --   --   --   ALBUMIN 2.7* 2.4* 2.6* 2.3*   No results for input(s): LIPASE, AMYLASE in the last 168 hours. No results for input(s): AMMONIA in the last 168 hours.  CBC:  Recent Labs Lab 12/26/14 2041  12/27/14 0349 12/29/14 0410 12/30/14 0244 12/31/14 0245 01/01/15 0610  WBC 13.7* 11.7* 9.9 10.0 11.3* 12.6*  NEUTROABS 12.9*  --   --   --   --   --   HGB 9.1* 8.4* 8.1* 8.2* 8.3* 8.5*  HCT 28.0* 25.6*  24.8* 23.9* 24.7* 25.1* 26.3*  MCV 87.8 90.1 90.9 87.9 89.3 90.4  PLT 186 161 129* 141* 129* 149*    Cardiac Enzymes:  Recent Labs Lab 12/27/14 0349  CKTOTAL 620*    BNP: Invalid input(s): POCBNP  CBG:  Recent Labs Lab 12/31/14 1714 12/31/14 2139 01/01/15 0607 01/01/15 0803 01/01/15 1028  GLUCAP 98 73 74 68* 140*    Microbiology: Results for orders placed or performed during the hospital encounter of 12/26/14  MRSA PCR Screening     Status: None   Collection Time: 12/26/14  7:57 PM  Result Value Ref Range Status   MRSA by PCR NEGATIVE NEGATIVE  Final    Comment:        The GeneXpert MRSA Assay (FDA approved for NASAL specimens only), is one component of a comprehensive MRSA colonization surveillance program. It is not intended to diagnose MRSA infection nor to guide or monitor treatment for MRSA infections.   Culture, blood (routine x 2) Call MD if unable to obtain prior to antibiotics being given     Status: None (Preliminary result)   Collection Time: 12/27/14  5:30 AM  Result Value Ref Range Status   Specimen Description BLOOD RIGHT HAND  Final   Special Requests BOTTLES DRAWN AEROBIC ONLY 10CC  Final   Culture   Final           BLOOD CULTURE RECEIVED NO GROWTH TO DATE CULTURE WILL BE HELD FOR 5 DAYS BEFORE ISSUING A FINAL NEGATIVE REPORT Performed at [facility]    Report Status PENDING  Incomplete  Culture, blood (routine x 2) Call MD if unable to obtain prior to antibiotics being given     Status: None (Preliminary result)   Collection Time: 12/27/14  5:35 AM  Result Value Ref Range Status   Specimen Description BLOOD RIGHT ARM  Final   Special Requests BOTTLES DRAWN AEROBIC ONLY 10CC  Final   Culture   Final           BLOOD CULTURE  RECEIVED NO GROWTH TO DATE CULTURE WILL BE HELD FOR 5 DAYS BEFORE ISSUING A FINAL NEGATIVE REPORT Performed at [facility]    Report Status PENDING  Incomplete  Culture, respiratory (NON-Expectorated)     Status: None (Preliminary result)   Collection Time: 12/28/14  8:11 PM  Result Value Ref Range Status   Specimen Description TRACHEAL ASPIRATE  Final   Special Requests NONE  Final   Gram Stain PENDING  Incomplete   Culture   Final    Culture reincubated for better growth Performed at [facility]    Report Status PENDING  Incomplete    Coagulation Studies: No results for input(s): LABPROT, INR in the last 72 hours.  Urinalysis: No results for input(s): COLORURINE, LABSPEC, PHURINE, GLUCOSEU, HGBUR, BILIRUBINUR, KETONESUR, PROTEINUR, UROBILINOGEN, NITRITE, LEUKOCYTESUR in the last 72 hours.  Invalid input(s): APPERANCEUR    Imaging: Dg Swallowing Func-speech Pathology  12/31/2014   Riley Nearing Deblois, CCC-SLP     12/31/2014  2:15 PM  Objective Swallowing Evaluation: Modified Barium Swallowing Study   Patient Details  Name: Edward [name] MRN: [id number] Date of Birth: [birth year]  Today's Date: 12/31/2014 Time: SLP Start Time (ACUTE ONLY): 1330-SLP Stop Time (ACUTE  ONLY): 1400 SLP Time Calculation (min) (ACUTE ONLY): 30 min  Past Medical History:  Past Medical History  Diagnosis Date  . Hypertension   . Hypothyroidism   . Diabetes mellitus without complication    Past Surgical History:  Past Surgical History  Procedure Laterality Date  . Chest tube placement     HPI:  HPI: Mr. Borner is a 69 yo man w/ PMHx of CVA with left-sided  hemiparesis, Type 2 DM, CKD Stage 4/5 and HTN who came from  [facility] after developing worsening AKI on CKD and  respiratory distress. Patient then with minimal UOP and worsening  AKI. HD was started 1/13 and ~2 L taken off. Patient then  developed worsening respiratory distress with aspiration or  nectar thick tomato juice on 1/13.  Intubated from 1/13 to 1/15  with self extubation. Pt had MBS on 1/13 recommending Dys  3/nectar  due to immediate aspiration of thin liquids with  intermittent sensation. Fluctuating arousal during exam.   No Data Recorded  Assessment / Plan / Recommendation CHL IP CLINICAL IMPRESSIONS 12/31/2014  Dysphagia Diagnosis Severe pharyngeal phase dysphagia  Clinical impression Pt presents with worsened function since last  MBS. Pt now with increased delay in swallow initiation and  increased weakness. Pt aspirating nectar thick liquids before the  swallow and honey thick liquids before/during the swallow due to  delay and decreased airway closure. Even purees and solids pose a  risk as pt has moderate oropharyngeal residuals and secretions  that mix with residuals. Pt observed to aspirate puree residuals  post swallow as it mixed with secretions. Sensation of aspirate  is significantly delayed and pt never fully clears airway.  Recommend short term alternate method of nutrition allowing pt to  recover further prior to starting a possible solids only diet.  SLP will follow for readiness.       CHL IP TREATMENT RECOMMENDATION 12/31/2014  Treatment Plan Recommendations F/U MBS in --- days (Comment)     CHL IP DIET RECOMMENDATION 12/31/2014  Diet Recommendations Alternative means - temporary  Liquid Administration via (None)  Medication Administration Via alternative means Compensations  (None)  Postural Changes and/or Swallow Maneuvers (None)     CHL IP OTHER RECOMMENDATIONS 12/31/2014  Recommended Consults (None)  Oral Care Recommendations Oral care Q4 per protocol  Other Recommendations (None)     CHL IP FOLLOW UP RECOMMENDATIONS 12/31/2014  Follow up Recommendations Skilled Nursing facility     [facility] IP FREQUENCY AND DURATION 12/31/2014  Speech Therapy Frequency (ACUTE ONLY) min 3x week  Treatment Duration 2 weeks     Pertinent Vitals/Pain NA    SLP Swallow Goals No flowsheet data found.  No flowsheet data found.    CHL IP REASON  FOR REFERRAL 12/28/2014 Reason for Referral  Objectively evaluate swallowing function     CHL IP ORAL PHASE 12/31/2014  Lips (None)  Tongue (None)  Mucous membranes (None)  Nutritional status (None)  Other (None)  Oxygen therapy (None)  Oral Phase WFL  Oral - Pudding Teaspoon (None)  Oral - Pudding Cup (None)  Oral - Honey Teaspoon (None)  Oral - Honey Cup (None)  Oral - Honey Syringe (None)  Oral - Nectar Teaspoon (None)  Oral - Nectar Cup (None)  Oral - Nectar Straw (None)  Oral - Nectar Syringe (None)  Oral - Ice Chips (None)  Oral - Thin Teaspoon (None)  Oral - Thin Cup (None)  Oral - Thin Straw (None)  Oral - Thin Syringe (None)  Oral - Puree (None)  Oral - Mechanical Soft (None)  Oral - Regular (None)  Oral - Multi-consistency (None)  Oral - Pill (None)  Oral Phase - Comment (None)      CHL IP PHARYNGEAL PHASE 12/31/2014  Pharyngeal Phase Impaired  Pharyngeal - Pudding Teaspoon (None)  Penetration/Aspiration details (pudding teaspoon) (None)  Pharyngeal - Pudding Cup (None)  Penetration/Aspiration details (pudding cup) (None)  Pharyngeal - Honey Teaspoon Delayed swallow initiation;Reduced  airway/laryngeal closure;Reduced epiglottic  inversion;Penetration/Aspiration before  swallow;Penetration/Aspiration during swallow;Moderate  aspiration;Pharyngeal residue - valleculae;Reduced tongue base  retraction  Penetration/Aspiration details (honey teaspoon) Material enters  airway, passes BELOW cords without attempt by patient to eject  out (silent aspiration)  Pharyngeal - Honey Cup Delayed swallow initiation;Reduced  airway/laryngeal closure;Reduced epiglottic  inversion;Penetration/Aspiration before  swallow;Penetration/Aspiration during swallow;Moderate  aspiration;Pharyngeal residue - valleculae;Reduced tongue base  retraction  Penetration/Aspiration details (honey cup) Material enters  airway, passes BELOW cords without attempt by patient to eject  out (silent aspiration)  Pharyngeal - Honey Syringe (None)   Penetration/Aspiration details (honey syringe) (None)  Pharyngeal - Nectar Teaspoon NT  Penetration/Aspiration details (nectar teaspoon) (None)  Pharyngeal - Nectar Cup Delayed swallow initiation;Reduced  airway/laryngeal closure;Reduced epiglottic  inversion;Penetration/Aspiration before swallow;Moderate  aspiration;Pharyngeal residue - valleculae;Reduced tongue base  retraction  Penetration/Aspiration details (nectar cup) Material enters  airway, passes BELOW cords and not ejected out despite cough  attempt by patient;Material enters airway, passes BELOW cords  without attempt by patient to eject out (silent aspiration)  Pharyngeal - Nectar Straw (None)  Penetration/Aspiration details (nectar straw) (None)  Pharyngeal - Nectar Syringe (None)  Penetration/Aspiration details (nectar syringe) (None)  Pharyngeal - Ice Chips (None)  Penetration/Aspiration details (ice chips) (None)  Pharyngeal - Thin Teaspoon (None)  Penetration/Aspiration details (thin teaspoon) (None)  Pharyngeal - Thin Cup NT  Penetration/Aspiration details (thin cup) (None)  Pharyngeal - Thin Straw NT  Penetration/Aspiration details (thin straw) (None)  Pharyngeal - Thin Syringe (None)  Penetration/Aspiration details (thin syringe') (None)  Pharyngeal - Puree Delayed swallow initiation;Reduced  airway/laryngeal closure;Reduced epiglottic  inversion;Penetration/Aspiration before  swallow;Penetration/Aspiration during swallow;Moderate  aspiration;Pharyngeal residue - valleculae  Penetration/Aspiration details (puree) Material enters airway,  passes BELOW cords without attempt by patient to eject out  (silent aspiration)  Pharyngeal - Mechanical Soft Pharyngeal residue -  valleculae;Reduced tongue base retraction;Reduced epiglottic  inversion;Delayed swallow initiation  Penetration/Aspiration details (mechanical soft) (None)  Pharyngeal - Regular NT  Penetration/Aspiration details (regular) (None)  Pharyngeal - Multi-consistency (None)   Penetration/Aspiration details (multi-consistency) (None)  Pharyngeal - Pill NT Penetration/Aspiration details (pill) (None)   Pharyngeal Comment chin tuck attempted with puree and honey, did  not improve function     No flowsheet data found.  No flowsheet data found.        Harlon DittyBonnie DeBlois, MA CCC-SLP [phone]  DeBlois, Riley NearingBonnie Caroline 12/31/2014, 2:12 PM      Medications:     . aspirin EC  325 mg Oral QPM  . calcium acetate  1,334 mg Oral TID WC  . dextrose      . feeding supplement (PRO-STAT SUGAR FREE 64)  30 mL Per Tube QID  . furosemide  120 mg Intravenous Q8H  . haloperidol lactate  1 mg Intravenous Once  . heparin  5,000 Units Subcutaneous 3 times per day  . insulin aspart  0-5 Units Subcutaneous QHS  . insulin aspart  0-9 Units Subcutaneous TID WC  . levothyroxine  50 mcg Per Tube QAC breakfast  . multivitamin  1 tablet Oral QHS  . simvastatin  20 mg Per Tube QPM  . sodium bicarbonate  650 mg Oral BID  . sodium chloride  3 mL Intravenous Q12H   sodium chloride, sodium chloride, acetaminophen (TYLENOL) oral liquid 160 mg/5 mL, albuterol, heparin, hydrALAZINE, lidocaine (PF), lidocaine-prilocaine, [DISCONTINUED] ondansetron **OR** ondansetron (ZOFRAN) IV, pentafluoroprop-tetrafluoroeth, RESOURCE THICKENUP CLEAR  Assessment/ Plan:  [name] is a 69yo man w/ PMHx of CVA with left-sided hemiparesis, Type 2 DM, CKD Stage 4/5 and HTN who came [facility] after developing worsening AKI on CKD and respiratory distress. Patient then with minimal UOP and worsening AKI. HD was started 1/13 and ~2 L taken off. Patient then developed worsening respiratory distress with aspiration after eating   ARF vs CKD This may be some progression of disease Creatinine has increased Last labs 18 months ago Cr 1.4   Hyerkalemia improved  Metabolic acidosis resolved  Volume overload  urine output better  It appears that the urine output is better and we may be able to avoid another  dialysis treatment Previous Treatments 1/13 and 1/15 Labs pending at this time I suspect that this is ATN with plateau in creatinine   If labs are improved then will sign off  If we are needed again to assist in the management. Dr Eliott Nine will be covering A service week of 01/02/15   LOS: 6 Keylen Eckenrode W @TODAY @11 :33 AM

## 2015-01-02 LAB — CBC
HCT: 27.1 % — ABNORMAL LOW (ref 39.0–52.0)
HEMOGLOBIN: 8.9 g/dL — AB (ref 13.0–17.0)
MCH: 29.9 pg (ref 26.0–34.0)
MCHC: 32.8 g/dL (ref 30.0–36.0)
MCV: 90.9 fL (ref 78.0–100.0)
Platelets: 169 10*3/uL (ref 150–400)
RBC: 2.98 MIL/uL — ABNORMAL LOW (ref 4.22–5.81)
RDW: 15.7 % — AB (ref 11.5–15.5)
WBC: 17.5 10*3/uL — ABNORMAL HIGH (ref 4.0–10.5)

## 2015-01-02 LAB — CULTURE, BLOOD (ROUTINE X 2)
CULTURE: NO GROWTH
Culture: NO GROWTH

## 2015-01-02 LAB — GLUCOSE, CAPILLARY
GLUCOSE-CAPILLARY: 120 mg/dL — AB (ref 70–99)
Glucose-Capillary: 150 mg/dL — ABNORMAL HIGH (ref 70–99)
Glucose-Capillary: 173 mg/dL — ABNORMAL HIGH (ref 70–99)
Glucose-Capillary: 237 mg/dL — ABNORMAL HIGH (ref 70–99)

## 2015-01-02 LAB — BASIC METABOLIC PANEL
ANION GAP: 7 (ref 5–15)
BUN: 58 mg/dL — ABNORMAL HIGH (ref 6–23)
CALCIUM: 7.3 mg/dL — AB (ref 8.4–10.5)
CO2: 29 mmol/L (ref 19–32)
CREATININE: 4.65 mg/dL — AB (ref 0.50–1.35)
Chloride: 104 mEq/L (ref 96–112)
GFR calc Af Amer: 14 mL/min — ABNORMAL LOW (ref >90)
GFR, EST NON AFRICAN AMERICAN: 12 mL/min — AB (ref >90)
GLUCOSE: 178 mg/dL — AB (ref 70–99)
Potassium: 3.6 mmol/L (ref 3.5–5.1)
Sodium: 140 mmol/L (ref 135–145)

## 2015-01-02 MED ORDER — SODIUM CHLORIDE 0.9 % IV SOLN
510.0000 mg | Freq: Once | INTRAVENOUS | Status: DC
  Filled 2015-01-02 (×2): qty 17

## 2015-01-02 MED ORDER — IBUPROFEN 400 MG PO TABS
400.0000 mg | ORAL_TABLET | ORAL | Status: DC | PRN
  Filled 2015-01-02: qty 1

## 2015-01-02 MED ORDER — ACETAMINOPHEN 325 MG PO TABS: 650.0000 mg | ORAL_TABLET | Freq: Four times a day (QID) | ORAL | Status: DC | PRN

## 2015-01-02 NOTE — Progress Notes (Signed)
Pt refused Ferumoxytol 510mg  IV until  Doctor Eliott NineDunham talks to him pt stated"my blood is rich enough I don"t need that iron" DR Allena KatzPatel notified no orders given.Artemio Alyheryl Yatzary Merriweather RN

## 2015-01-02 NOTE — Progress Notes (Signed)
NUTRITION FOLLOW UP  INTERVENTION: Discontinue Prostat.  Provide nourishment snacks. (ordered)  NUTRITION DIAGNOSIS: Inadequate oral intake related to inability to eat as evidenced by NPO status; Resolved.  NEW NUTRITION DX: Increased nutrient needs related to acute renal failure as evidenced by estimated nutrition needs.  Goal: Intake to meet >90% of estimated nutrition needs; met  Monitor:  PO intake, weight trend, labs, I/O's  69 y.o. male  Admitting Dx: Acute renal failure  ASSESSMENT: Patient came to [facility] from [facility] on 1/11 after developing worsening AKI on CKD and respiratory distress. Intubated on 1/13.   Extubated 1/15.  Pt is currently on a renal/carbohydrate modified diet with 1200 ml fluids. Meal completion has been varied from 25-100%, however most meals are 100%. Pt reports having a great appetite. Last HD was 1/15. Plans to not remove HD catheter yet. Pt reports he does not like the Prostat and would like it discontinue. RD to d/c order. Pt was offered alternative oral supplements, however pt declined. Pt did agree to nourishment snacks. Pt was encouraged to eat his food at meals.   Labs: Low calcium and GFR. High BUN and creatinine.  Height: Ht Readings from Last 1 Encounters:  12/26/14 _0  (1.727 m)    Weight: Wt Readings from Last 1 Encounters:  01/01/15 186 lb 9.6 oz (84.641 kg)  12/26/14 221 lbs  BMI:  Body mass index is 28.38 kg/(m^2).  Re-Estimated Nutritional Needs: Kcal: 2100-2300 Protein: 105-115 gm Fluid: 1.2 L/day  Skin: +1 generalized edema, +2 UE edema, +3 LE edema  Diet Order: Diet renal/carb modified with 1200 ml fluid restriction   Intake/Output Summary (Last 24 hours) at 01/02/15 1529 Last data filed at 01/02/15 1307  Gross per 24 hour  Intake    695 ml  Output   2750 ml  Net  -2055 ml    Last BM: 1/17  Labs:   Recent Labs Lab 12/28/14 0535  12/30/14 0800  12/31/14 0245 01/01/15 0610 01/02/15 0500   NA 131*  < > 142  < > 143 146* 140  K 5.0  < > 4.8  < > 4.1 3.7 3.6  CL 107  < > 106  < > 108 109 104  CO2 11*  < > 20  < > _1 BUN 107*  < > 89*  < > 57* 60* 58*  CREATININE 6.50*  < > 5.52*  < > 4.47* 4.88* 4.65*  CALCIUM 6.2*  < > 6.9*  < > 7.3* 7.2* 7.3*  MG  --   --   --   --   --  1.7  --   PHOS 9.4*  --  7.9*  --   --  6.4*  --   GLUCOSE 155*  < > 112*  < > 80 75 178*  < > = values in this interval not displayed.  CBG (last 3)   Recent Labs  01/01/15 2118 01/02/15 0802 01/02/15 1131  GLUCAP 141* 150* 173*    Scheduled Meds: . aspirin EC  325 mg Oral QPM  . calcium acetate  1,334 mg Oral TID WC  . feeding supplement (PRO-STAT SUGAR FREE 64)  30 mL Per Tube QID  . ferumoxytol  510 mg Intravenous Once  . furosemide  120 mg Intravenous Q8H  . heparin  5,000 Units Subcutaneous 3 times per day  . insulin aspart  0-5 Units Subcutaneous QHS  . insulin aspart  0-9 Units Subcutaneous TID WC  . levothyroxine  50 mcg Per Tube QAC breakfast  . multivitamin  1 tablet Oral QHS  . simvastatin  20 mg Per Tube QPM  . sodium bicarbonate  650 mg Oral BID  . sodium chloride  3 mL Intravenous Q12H    Continuous Infusions:    Past Medical History  Diagnosis Date  . Hypertension   . Hypothyroidism   . Diabetes mellitus without complication     Past Surgical History  Procedure Laterality Date  . Chest tube placement      Kallie Locks, MS, RD, LDN Pager # [phone] After hours/ weekend pager # [phone]

## 2015-01-02 NOTE — Evaluation (Signed)
Physical Therapy Evaluation Patient Details Name: Edward [name] MRN: [id number] DOB: [birth year] Today's Date: 01/02/2015   History of Present Illness  Pt is a 69 y.o. male with history of CVA and left-sided hemiparesis, diabetes mellitus type 2, hypertension and hypothyroidism was taken to [facility] 2 days PTA after patient has been having increasing weakness with lower extremity edema. Patient was found to have elevated creatinine and low hemoglobin. Patient's respiratory status got worse and transferred to [facility] for further management of the patient's worsening renal function. Patient has significant edema in the both lower extremities and has distended abdomen.   Clinical Impression  Pt admitted with above diagnosis. Pt currently with functional limitations due to the deficits listed below (see PT Problem List). At the time of PT eval pt was able to perform transfers and ambulation with min guard assist. Increased cueing required for general safety awareness as pt is somewhat impulsive. Tolerance for functional activity is decreased from baseline.  Pt will benefit from skilled PT to increase their independence and safety with mobility to allow discharge to the venue listed below.  Pt agreeable to SNF as he lives alone and does not have outside support if needed at d/c.     Follow Up Recommendations SNF;Supervision/Assistance - 24 hour    Equipment Recommendations  None recommended by PT    Recommendations for Other Services       Precautions / Restrictions Precautions Precautions: Fall Precaution Comments: Residual L sided weakness from previous stroke Restrictions Weight Bearing Restrictions: No      Mobility  Bed Mobility               General bed mobility comments: Pt sitting up in recliner upon PT arrival.   Transfers Overall transfer level: Needs assistance Equipment used: Rolling walker (2 wheeled) Transfers: Sit to/from Stand Sit to Stand: Min  guard         General transfer comment: Pt required increased cueing to wait for therapist prior to initiating transfer, however was able to power-up to full standing without assistance.   Ambulation/Gait Ambulation/Gait assistance: Min guard Ambulation Distance (Feet): 100 Feet Assistive device: Rolling walker (2 wheeled) Gait Pattern/deviations: Step-through pattern;Decreased stride length;Trunk flexed Gait velocity: Decreased Gait velocity interpretation: Below normal speed for age/gender General Gait Details: Pt with good use of the RW, and does not demonstrate any unsteadiness. Pt fatigues quickly and returns to room for seated rest break after ~50 feet.   Stairs            Wheelchair Mobility    Modified Rankin (Stroke Patients Only)       Balance Overall balance assessment: Needs assistance Sitting-balance support: Feet supported;No upper extremity supported Sitting balance-Leahy Scale: Fair     Standing balance support: Bilateral upper extremity supported Standing balance-Leahy Scale: Poor Standing balance comment: Pt requires UE support to maintain standing balance.                              Pertinent Vitals/Pain Pain Assessment: No/denies pain    Home Living Family/patient expects to be discharged to:: Private residence Living Arrangements: Alone Available Help at Discharge: Other (Comment) (Pt states he has no one to call on if he needed assist) Type of Home: House Home Access: Stairs to enter   [employer] of Steps: 4 Home Layout: One level Home Equipment: Cane - single point;Walker - 2 wheels      Prior Function  Level of Independence: Independent         Comments: Pt states he was not using any AD PTA     Hand Dominance        Extremity/Trunk Assessment   Upper Extremity Assessment: Defer to OT evaluation;LUE deficits/detail       LUE Deficits / Details: Decreased strength and AROM consistent with  residual weakness from prior CVA. Keeps extremity in an overall flexed position at rest.   Lower Extremity Assessment: LLE deficits/detail   LLE Deficits / Details: Decreased strength and AROM consistent with residual weakness from previous CVA  Cervical / Trunk Assessment: Normal  Communication   Communication: No difficulties  Cognition Arousal/Alertness: Awake/alert Behavior During Therapy: WFL for tasks assessed/performed Overall Cognitive Status: Within Functional Limits for tasks assessed                      General Comments      Exercises General Exercises - Lower Extremity Ankle Circles/Pumps: 10 reps Quad Sets: 10 reps Gluteal Sets: 10 reps Long Arc Quad: 10 reps Heel Slides: 10 reps Hip ABduction/ADduction: 10 reps      Assessment/Plan    PT Assessment Patient needs continued PT services  PT Diagnosis Difficulty walking;Generalized weakness   PT Problem List Decreased strength;Decreased range of motion;Decreased activity tolerance;Decreased balance;Decreased mobility;Decreased knowledge of use of DME;Decreased safety awareness;Decreased knowledge of precautions  PT Treatment Interventions DME instruction;Gait training;Stair training;Functional mobility training;Therapeutic activities;Therapeutic exercise;Neuromuscular re-education;Patient/family education   PT Goals (Current goals can be found in the Care Plan section) Acute Rehab PT Goals Patient Stated Goal[place] PT Goal Formulation: With patient Time For Goal Achievement: 01/16/15 Potential to Achieve Goals: Good    Frequency Min 3X/week   Barriers to discharge        Co-evaluation               End of Session Equipment Utilized During Treatment: Gait belt Activity Tolerance: Patient limited by fatigue Patient left: in chair;with call bell/phone within reach;with nursing/sitter in room Nurse Communication: Mobility status         Time: [id number] PT Time  Calculation (min) (ACUTE ONLY): 25 min   Charges:   PT Evaluation $Initial PT Evaluation Tier I: 1 Procedure PT Treatments $Gait Training: 8-22 mins $Therapeutic Exercise: 8-22 mins   PT G Codes:        Conni SlipperKirkman, Jazlene Bares 01/02/2015, 1:53 PM   Conni SlipperLaura Quintella Mura, PT, DPT Acute Rehabilitation Services Pager: [phone]

## 2015-01-02 NOTE — Progress Notes (Signed)
01/01/15 2145  Noted that patient was not wearing his ID wrist band and that it was laying on the WOW in the room. Asked patient if he wanted me to print him a new one and place on his wrist again and he stated no that he did not want to wear the ID band at all and told me [facility]. Made patient verbalize his first and last name and then his date of birth prior to giving him his night medication. Will continue to assess and monitor the patient.   Edward [name] [employer] BSN, RN

## 2015-01-02 NOTE — Progress Notes (Signed)
Patient ID: Edward [name]  male  ZOX:[id number]    DOB: 1946/11/01    DOA: 12/26/2014  PCP: Edward Bane, MD   Brief history of present illness  Patient is a 69 year old male with prior CVA with left-sided hemiparesis, type 2 diabetes, hypertension, hypothyroidism was taken to [facility] 2 days ago after patient was having increasing weakness with lower extremities edema. Patient was found to have elevated creatinine, low hemoglobin. Chest x-ray showed possible pneumonia. Patient also received 2 units of packed RBCs at [facility] for the anemia. His respiratory status however continued to worsen and was given Lasix and transferred to [facility] for further management. 2-D echo at [facility] showed EF 55-60% with moderate pulmonary hypertension and LVH. Renal ultrasound was normal. Hemoglobin on admission 7.6, creatinine 6.1 with bicarbonate of 13, ABG showed pH of 7.1. Patient's home medication listed that patient was on ACE inhibitor otherwise he was not aware of having kidney problems On 1/13, patient was noticed to be in respiratory distress with rhonchorous lungs, nephrology service had been following, hemodialysis was initiated on 1/13 and 2 L were taken off. However in the evening, patient went into respiratory distress after aspirating on the supper. Critical care was consulted and patient was transferred to ICU and intubated. Patient had repeat hemodialysis on 1/15, transferred back to the floor, TRH assumed Care on 1/17  Assessment/Plan: Principal Problem:   Acute respiratory failure with aspiration pneumonia, likely due to volume overload from acute renal failure and aspiration - Patient was extubated on 1/15, currently stable, continue O2 as needed - Followed closely by speech therapy, restarted on dysphagia 3 diet today - Leukocytosis up, follow closely, was on steroids, discontinued. If febrile, may need antibiotics, blood cultures 1/12 negative, HIV negative,  sputum culture negative.  Active Problems: Acute renal failure on CKD - Hemodialysis initiated on 1/13, repeat on 1/15 - Currently on Lasix 120 mg IV q8hours, creatinine 4.65, nephrology following    Essential hypertension: Uncontrolled - Continue IV Lasix, IV hydralazine as needed    Anemia in chronic kidney disease -Hemoglobin currently stable     Diabetes mellitus type 2, uncontrolled -Continue sliding scale insulin     Hypothyroidism - Continue Synthroid    History of stroke with left-sided hemiparesis - Continueaspirin,  statin  Acute encephalopathy - Likely metabolic encephalopathy secondary to acute renal failure, currently stable.  DVT Prophylaxis:  Code Status: Full code  Family Communication:  Disposition: Pending per renal recommendations, PT evaluation pending Consultants:  Critical care    nephrology  Procedures:  Hemodialysis   Antibiotics:  None    Subjective: Patient seen and examined, feeling somewhat better today  Objective: Weight change:   Intake/Output Summary (Last 24 hours) at 01/02/15 1109 Last data filed at 01/02/15 0806  Gross per 24 hour  Intake    575 ml  Output   2050 ml  Net  -1475 ml   Blood pressure 127/94, pulse 79, temperature 98.1 F (36.7 C), temperature source Oral, resp. rate 18, height 5\' 8"  (1.727 m), weight 84.641 kg (186 lb 9.6 oz), SpO2 99 %.  Physical Exam: General: Alert and awake, oriented , not in any acute distress. CVS: S1-S2 clear, no murmur rubs or gallops Chest: Decreased breath sounds at the bases  Abdomen: soft nontender, nondistended, normal bowel sounds  Extremities: no cyanosis, clubbing. 2+ edema noted bilaterally   Lab Results: Basic Metabolic Panel:  Recent Labs Lab 01/01/15 0610 01/02/15 0500  NA 146* 140  K 3.7 3.6  CL 109 104  CO2 20 29  GLUCOSE 75 178*  BUN 60* 58*  CREATININE 4.88* 4.65*  CALCIUM 7.2* 7.3*  MG 1.7  --   PHOS 6.4*  --    Liver Function  Tests:  Recent Labs Lab 12/26/14 2041  12/28/14 0535 12/30/14 0800  AST 14  --   --   --   ALT 14  --   --   --   ALKPHOS 45  --   --   --   BILITOT 0.5  --   --   --   PROT 5.5*  --   --   --   ALBUMIN 2.7*  < > 2.6* 2.3*  < > = values in this interval not displayed. No results for input(s): LIPASE, AMYLASE in the last 168 hours. No results for input(s): AMMONIA in the last 168 hours. CBC:  Recent Labs Lab 12/26/14 2041  01/01/15 0610 01/02/15 0500  WBC 13.7*  < > 12.6* 17.5*  NEUTROABS 12.9*  --   --   --   HGB 9.1*  < > 8.5* 8.9*  HCT 28.0*  < > 26.3* 27.1*  MCV 87.8  < > 90.4 90.9  PLT 186  < > 149* 169  < > = values in this interval not displayed. Cardiac Enzymes:  Recent Labs Lab 12/27/14 0349  CKTOTAL 620*   BNP: Invalid input(s): POCBNP CBG:  Recent Labs Lab 01/01/15 1028 01/01/15 1251 01/01/15 1725 01/01/15 2118 01/02/15 0802  GLUCAP 140* 109* 145* 141* 150*     Micro Results: Recent Results (from the past 240 hour(s))  MRSA PCR Screening     Status: None   Collection Time: 12/26/14  7:57 PM  Result Value Ref Range Status   MRSA by PCR NEGATIVE NEGATIVE Final    Comment:        The GeneXpert MRSA Assay (FDA approved for NASAL specimens only), is one component of a comprehensive MRSA colonization surveillance program. It is not intended to diagnose MRSA infection nor to guide or monitor treatment for MRSA infections.   Culture, blood (routine x 2) Call MD if unable to obtain prior to antibiotics being given     Status: None   Collection Time: 12/27/14  5:30 AM  Result Value Ref Range Status   Specimen Description BLOOD RIGHT HAND  Final   Special Requests BOTTLES DRAWN AEROBIC ONLY 10CC  Final   Culture   Final    NO GROWTH 5 DAYS Performed at [facility]    Report Status 01/02/2015 FINAL  Final  Culture, blood (routine x 2) Call MD if unable to obtain prior to antibiotics being given     Status: None   Collection Time:  12/27/14  5:35 AM  Result Value Ref Range Status   Specimen Description BLOOD RIGHT ARM  Final   Special Requests BOTTLES DRAWN AEROBIC ONLY 10CC  Final   Culture   Final    NO GROWTH 5 DAYS Performed at [facility]    Report Status 01/02/2015 FINAL  Final  Culture, respiratory (NON-Expectorated)     Status: None   Collection Time: 12/28/14  8:11 PM  Result Value Ref Range Status   Specimen Description TRACHEAL ASPIRATE  Final   Special Requests NONE  Final   Gram Stain   Final    NO WBC SEEN NO SQUAMOUS EPITHELIAL CELLS SEEN NO ORGANISMS SEEN Performed at [facility]  Final    Non-Pathogenic Oropharyngeal-type Flora Isolated. Performed at [facility]    Report Status 01/01/2015 FINAL  Final    Studies/Results: Portable Chest Xray  12/29/2014   CLINICAL DATA:  Respiratory failure.  EXAM: PORTABLE CHEST - 1 VIEW  COMPARISON:  12/28/2014.  FINDINGS: Interim placement of NGT. Its tip is below the left hemidiaphragm. Endotracheal tube and right IJ line in stable position. Low lung volumes with bibasilar atelectasis and/or infiltrates. No pleural effusion or pneumothorax. Stable cardiomegaly. Normal pulmonary vascularity.  IMPRESSION: 1. Interval placement NG tube, its tip is below the left hemidiaphragm. Endotracheal tube and right IJ line in stable position. 2. Low lung volumes with bibasilar atelectasis and/or infiltrates.   Electronically Signed   By: Maisie Fus  Register   On: 12/29/2014 07:29   Dg Chest Port 1 View  12/28/2014   CLINICAL DATA:  Intubation, respiratory failure  EXAM: PORTABLE CHEST - 1 VIEW  COMPARISON:  12/28/2014  FINDINGS: Endotracheal tube 5 cm above the carina. Right IJ dialysis catheter tip SVC RA junction level. Low lung volumes noted with mild vascular congestion and basilar atelectasis. Normal heart size. No effusion or pneumothorax. Stable exam.  IMPRESSION: Endotracheal tube 5 cm above the carina.  Stable low lung volumes  and vascular congestion   Electronically Signed   By: Ruel Favors M.D.   On: 12/28/2014 19:58   Dg Chest Port 1 View  12/28/2014   CLINICAL DATA:  Central line placement.  EXAM: PORTABLE CHEST - 1 VIEW  COMPARISON:  Single view of the chest 12/26/2014.  FINDINGS: Right IJ catheter is in place with the tip just within the right atrium. The tube could be withdrawn 2 cm for positioning at the superior cavoatrial junction. Lung volumes are low with right basilar airspace disease again seen. Heart size is normal. There is no pneumothorax. Right pleural effusion is noted.  IMPRESSION: Right IJ catheter tip projects within the right atrium. The line could be withdrawn 2 cm for positioning of the superior cavoatrial junction. Negative for pneumothorax.  No change in right effusion and basilar airspace disease.   Electronically Signed   By: Drusilla Kanner M.D.   On: 12/28/2014 13:41   Dg Chest Port 1 View  12/26/2014   CLINICAL DATA:  Fluid overload.  Shortness of breath.  EXAM: PORTABLE CHEST - 1 VIEW  COMPARISON:  Earlier the same day at 05:12 hr  FINDINGS: Portable AP view at 1956 hr: Low lung volumes persist. There is improving right basilar aeration with decreasing right basilar airspace disease. Unchanged left basilar airspace disease. Pulmonary vascular congestion, unchanged from prior. Cardiomediastinal contours are unchanged. Question small bilateral pleural effusions.  IMPRESSION: Improving right basilar aeration with decreasing airspace opacity. Question small bilateral pleural effusions.   Electronically Signed   By: Rubye Oaks M.D.   On: 12/26/2014 20:37   Dg Abd Portable 1v  12/29/2014   CLINICAL DATA:  Orogastric tube placement.  Initial encounter.  EXAM: PORTABLE ABDOMEN - 1 VIEW  COMPARISON:  None.  FINDINGS: The patient's enteric tube is noted coiling at the body of the stomach and ending overlying the fundus of the stomach.  The visualized bowel gas pattern is grossly unremarkable.  Contrast is seen filling the cecum, ascending colon and base of the appendix. Mild right basilar airspace opacity may reflect atelectasis. No acute osseous abnormalities are seen.  IMPRESSION: 1. Enteric tube noted coiling at the body of the stomach and ending overlying the fundus of the stomach.  2. Mild right basilar airspace opacity may reflect atelectasis.   Electronically Signed   By: Roanna RaiderJeffery  Chang M.D.   On: 12/29/2014 02:38   Dg Swallowing Func-speech Pathology  12/31/2014   Riley NearingBonnie Caroline Deblois, CCC-SLP     12/31/2014  2:15 PM  Objective Swallowing Evaluation: Modified Barium Swallowing Study   Patient Details  Name: Erin SonsKenneth L Heilman MRN: [id number] Date of Birth: [birth year]  Today's Date: 12/31/2014 Time: SLP Start Time (ACUTE ONLY): 1330-SLP Stop Time (ACUTE  ONLY): 1400 SLP Time Calculation (min) (ACUTE ONLY): 30 min  Past Medical History:  Past Medical History  Diagnosis Date  . Hypertension   . Hypothyroidism   . Diabetes mellitus without complication    Past Surgical History:  Past Surgical History  Procedure Laterality Date  . Chest tube placement     HPI:  HPI: [name] is a 69 yo man w/ PMHx of CVA with left-sided  hemiparesis, Type 2 DM, CKD Stage 4/5 and HTN [facility] developing worsening AKI on CKD and  respiratory distress. Patient then with minimal UOP and worsening  AKI. HD was started 1/13 and ~2 L taken off. Patient then  developed worsening respiratory distress with aspiration or  nectar thick tomato juice on 1/13. Intubated from 1/13 to 1/15  with self extubation. Pt had MBS on 1/13 recommending Dys  3/nectar due to immediate aspiration of thin liquids with  intermittent sensation. Fluctuating arousal during exam.   No Data Recorded  Assessment / Plan / Recommendation CHL IP CLINICAL IMPRESSIONS 12/31/2014  Dysphagia Diagnosis Severe pharyngeal phase dysphagia  Clinical impression Pt presents with worsened function since last  MBS. Pt now with increased delay  in swallow initiation and  increased weakness. Pt aspirating nectar thick liquids before the  swallow and honey thick liquids before/during the swallow due to  delay and decreased airway closure. Even purees and solids pose a  risk as pt has moderate oropharyngeal residuals and secretions  that mix with residuals. Pt observed to aspirate puree residuals  post swallow as it mixed with secretions. Sensation of aspirate  is significantly delayed and pt never fully clears airway.  Recommend short term alternate method of nutrition allowing pt to  recover further prior to starting a possible solids only diet.  SLP will follow for readiness.       CHL IP TREATMENT RECOMMENDATION 12/31/2014  Treatment Plan Recommendations F/U MBS in --- days (Comment)     CHL IP DIET RECOMMENDATION 12/31/2014  Diet Recommendations Alternative means - temporary  Liquid Administration via (None)  Medication Administration Via alternative means Compensations  (None)  Postural Changes and/or Swallow Maneuvers (None)     CHL IP OTHER RECOMMENDATIONS 12/31/2014  Recommended Consults (None)  Oral Care Recommendations Oral care Q4 per protocol  Other Recommendations (None)     CHL IP FOLLOW UP RECOMMENDATIONS 12/31/2014  Follow up Recommendations [facility] D[facility] 12/31/2014  Speech Therapy Frequency (ACUTE ONLY) min 3x week  Treatment Duration 2 weeks     Pertinent Vitals/Pain NA    SLP Swallow Goals No flowsheet data found.  No flowsheet data found.    CHL IP REASON FOR REFERRAL 12/28/2014 Reason for Referral  Objectively evaluate swallowing function     CHL IP ORAL PHASE 12/31/2014  Lips (None)  Tongue (None)  Mucous membranes (None)  Nutritional status (None)  Other (None)  Oxygen therapy (None)  Oral Phase [facility]  Oral - Pudding Teaspoon (None)  Oral - Pudding Cup (None)  Oral - Honey Teaspoon (None)  Oral - Honey Cup (None)  Oral - Honey Syringe (None)  Oral - Nectar Teaspoon (None)  Oral - Nectar Cup (None)   Oral - Nectar Straw (None)  Oral - Nectar Syringe (None)  Oral - Ice Chips (None)  Oral - Thin Teaspoon (None)  Oral - Thin Cup (None)  Oral - Thin Straw (None)  Oral - Thin Syringe (None)  Oral - Puree (None)  Oral - Mechanical Soft (None)  Oral - Regular (None)  Oral - Multi-consistency (None)  Oral - Pill (None)  Oral Phase - Comment (None)      CHL IP PHARYNGEAL PHASE 12/31/2014  Pharyngeal Phase Impaired  Pharyngeal - Pudding Teaspoon (None)  Penetration/Aspiration details (pudding teaspoon) (None)  Pharyngeal - Pudding Cup (None)  Penetration/Aspiration details (pudding cup) (None)  Pharyngeal - Honey Teaspoon Delayed swallow initiation;Reduced  airway/laryngeal closure;Reduced epiglottic  inversion;Penetration/Aspiration before  swallow;Penetration/Aspiration during swallow;Moderate  aspiration;Pharyngeal residue - valleculae;Reduced tongue base  retraction  Penetration/Aspiration details (honey teaspoon) Material enters  airway, passes BELOW cords without attempt by patient to eject  out (silent aspiration)  Pharyngeal - Honey Cup Delayed swallow initiation;Reduced  airway/laryngeal closure;Reduced epiglottic  inversion;Penetration/Aspiration before  swallow;Penetration/Aspiration during swallow;Moderate  aspiration;Pharyngeal residue - valleculae;Reduced tongue base  retraction  Penetration/Aspiration details (honey cup) Material enters  airway, passes BELOW cords without attempt by patient to eject  out (silent aspiration)  Pharyngeal - Honey Syringe (None)  Penetration/Aspiration details (honey syringe) (None)  Pharyngeal - Nectar Teaspoon NT  Penetration/Aspiration details (nectar teaspoon) (None)  Pharyngeal - Nectar Cup Delayed swallow initiation;Reduced  airway/laryngeal closure;Reduced epiglottic  inversion;Penetration/Aspiration before swallow;Moderate  aspiration;Pharyngeal residue - valleculae;Reduced tongue base  retraction  Penetration/Aspiration details (nectar cup) Material enters  airway,  passes BELOW cords and not ejected out despite cough  attempt by patient;Material enters airway, passes BELOW cords  without attempt by patient to eject out (silent aspiration)  Pharyngeal - Nectar Straw (None)  Penetration/Aspiration details (nectar straw) (None)  Pharyngeal - Nectar Syringe (None)  Penetration/Aspiration details (nectar syringe) (None)  Pharyngeal - Ice Chips (None)  Penetration/Aspiration details (ice chips) (None)  Pharyngeal - Thin Teaspoon (None)  Penetration/Aspiration details (thin teaspoon) (None)  Pharyngeal - Thin Cup NT  Penetration/Aspiration details (thin cup) (None)  Pharyngeal - Thin Straw NT  Penetration/Aspiration details (thin straw) (None)  Pharyngeal - Thin Syringe (None)  Penetration/Aspiration details (thin syringe') (None)  Pharyngeal - Puree Delayed swallow initiation;Reduced  airway/laryngeal closure;Reduced epiglottic  inversion;Penetration/Aspiration before  swallow;Penetration/Aspiration during swallow;Moderate  aspiration;Pharyngeal residue - valleculae  Penetration/Aspiration details (puree) Material enters airway,  passes BELOW cords without attempt by patient to eject out  (silent aspiration)  Pharyngeal - Mechanical Soft Pharyngeal residue -  valleculae;Reduced tongue base retraction;Reduced epiglottic  inversion;Delayed swallow initiation  Penetration/Aspiration details (mechanical soft) (None)  Pharyngeal - Regular NT  Penetration/Aspiration details (regular) (None)  Pharyngeal - Multi-consistency (None)  Penetration/Aspiration details (multi-consistency) (None)  Pharyngeal - Pill NT Penetration/Aspiration details (pill) (None)   Pharyngeal Comment chin tuck attempted with puree and honey, did  not improve function     No flowsheet data found.  No flowsheet data found.        Harlon Ditty, MA CCC-SLP [phone]  Dyanne Iha Riley Nearing 12/31/2014, 2:12 PM    Dg Swallowing Func-speech Pathology  12/28/2014   Carolan Shiver, CCC-SLP     12/28/2014  2:04 PM  Objective Swallowing Evaluation: Modified Barium Swallowing  Study   Patient Details  Name: ABDUR HOGLUND MRN: [id number] Date of Birth: 03-29-1946  Today's Date: 12/28/2014 Time: 1100-1130 SLP Time Calculation (min) (ACUTE ONLY): 30 min  Past Medical History:  Past Medical History  Diagnosis Date  . Hypertension   . Hypothyroidism   . Diabetes mellitus without complication    Past Surgical History:  Past Surgical History  Procedure Laterality Date  . Chest tube placement     HPI:  Pt is 69 y.o. 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  He reports  swallowing difficulty immediately post-stroke, but not in recent  years.  Poor performance during clinical swallow eval with  concerns for aspiration, so MBS recommended.        Assessment / Plan / Recommendation Clinical Impression  Dysphagia Diagnosis: Mild-mod pharyngeal phase dysphagia  Clinical impression: Pt presents with a mild-mod pharyngeal  dysphagia marked by premature spillage of POs into pharynx,  immediate aspiration of thin liquids (not consistently  accompanied by a cough), and mild vallecular residue of solids  post-swallow.  Pt with fluctuating alertness during study,  requiring frequent prompts to remain awake.  Compensatory  techniques were not effective due to mentation.  Recommend  initiating a dysphagia 3 diet with nectar-thick liquids; meds  crushed with puree.  SLP to follow for diet toleration and  advancement.     Treatment Recommendation  Therapy as outlined in treatment plan below    Diet Recommendation Dysphagia 3 (Mechanical Soft);Nectar-thick  liquid   Liquid Administration via: Cup Medication Administration: Crushed with puree Supervision: Full supervision/cueing for compensatory strategies Compensations: Slow rate;Small sips/bites (hold tray if coughing) Postural Changes and/or Swallow Maneuvers: Seated upright 90  degrees    Other  Recommendations Oral Care Recommendations:  Oral care BID   Follow Up Recommendations   (tba)    Frequency and Duration min 3x week  2 weeks        SLP Swallow Goals     General Date of Onset: 12/26/14     Type of Study: Modified Barium Swallowing Study Reason for Referral: Objectively evaluate swallowing function Previous Swallow Assessment: clinical swallow evaluation 12/28/14 Diet Prior to this Study: NPO Temperature Spikes Noted: No Respiratory Status: Room air History of Recent Intubation: No Behavior/Cognition: Lethargic Oral Cavity - Dentition: Adequate natural dentition Self-Feeding Abilities: Able to feed self Patient Positioning: Upright in chair Baseline Vocal Quality: Wet Volitional Cough: Weak;Congested Volitional Swallow: Able to elicit Anatomy:  (presence of likely osteophytes C4-5 - no radiologist  present to confirm) Pharyngeal Secretions: Not observed secondary MBS    Reason for Referral Objectively evaluate swallowing function   Oral Phase Oral Preparation/Oral Phase Oral Phase: WFL   Pharyngeal Phase Pharyngeal Phase Pharyngeal Phase: Impaired Pharyngeal - Nectar Pharyngeal - Nectar Cup: Premature spillage to valleculae;Reduced  tongue base retraction Pharyngeal - Thin Pharyngeal - Thin Cup: Premature spillage to pyriform  sinuses;Reduced airway/laryngeal closure;Penetration/Aspiration  during swallow;Penetration/Aspiration after swallow;Trace  aspiration Penetration/Aspiration details (thin cup): Material enters  airway, passes BELOW cords without attempt by patient to eject  out (silent aspiration);Material enters airway, passes BELOW  cords and not ejected out despite cough attempt by patient Pharyngeal - Thin Straw: Premature spillage to pyriform  sinuses;Reduced airway/laryngeal closure;Penetration/Aspiration  during swallow;Penetration/Aspiration after swallow;Trace  aspiration Penetration/Aspiration details (thin straw): Material enters  airway, passes BELOW cords and not ejected out despite cough  attempt by patient;Material enters  airway, passes BELOW cords  without attempt by patient to eject out (silent aspiration) Pharyngeal - Solids Pharyngeal - Puree: Premature spillage to valleculae;Reduced  tongue base retraction;Pharyngeal residue - valleculae Pharyngeal - Regular: Premature spillage to valleculae;Reduced  tongue base retraction;Pharyngeal residue - valleculae     Amanda L. Samson Frederic, [place] CCC/SLP Pager [phone]               Blenda Mounts Laurice 12/28/2014, 2:03 PM     Medications: Scheduled Meds: . aspirin EC  325 mg Oral QPM  . calcium acetate  1,334 mg Oral TID WC  . feeding supplement (PRO-STAT SUGAR FREE 64)  30 mL Per Tube QID  . furosemide  120 mg Intravenous Q8H  . heparin  5,000 Units Subcutaneous 3 times per day  . insulin aspart  0-5 Units Subcutaneous QHS  . insulin aspart  0-9 Units Subcutaneous TID WC  . levothyroxine  50 mcg Per Tube QAC breakfast  . multivitamin  1 tablet Oral QHS  . simvastatin  20 mg Per Tube QPM  . sodium bicarbonate  650 mg Oral BID  . sodium chloride  3 mL Intravenous Q12H   Time spent: 25 mins   LOS: 7 days   Deaisa Merida M.D. [facility] 01/02/2015, 11:09 AM Pager: [phone]  If 7PM-7AM, please contact night-coverage [email or web address] Password [email or web address]

## 2015-01-02 NOTE — Progress Notes (Signed)
Speech Language Pathology Treatment: Dysphagia  Patient Details Name: Edward [name] MRN: [id number] DOB: Sep 22, 1946 Today's Date: 01/02/2015 Time: [id number] SLP Time Calculation (min) (ACUTE ONLY): 17 min  Assessment / Plan / Recommendation Clinical Impression  Pt adamantly refusing to consume thickened liquids and appears to have capacity to make decisions. SLP provided rationale and direct evidence of aspiration - trials of thin liquids with overt coughing following intake. Discussed how oral bacteria and aspiration of liquids can lead to progressive infiltrates in the lungs. Offered method of slowly progressing pt from thick to thin liquids as recovery continues, which pt refused. Pt verbalized understanding of all education, agreed that it appears that he is aspirating and accepted the risks of aspiration. He wants to consume thin liquids against my recommendations. Suggested that pt avoid any liquids other than water to reduce negative impact of aspiration, which he agreed to. SLP also reviewed strategies and precautions that may help pt protect his airway - chin tuck, small sips, intermittent throat clearing. Pt again verbalized understanding. Discussed with MD and RN. Will write diet order for Regular diet and thin liquids and sign off as pt is otherwise unwilling to participate with speech therapy.    HPI HPI: Edward [name] is a 69 yo man w/ PMHx of CVA with left-sided hemiparesis, Type 2 DM, CKD Stage 4/5 and HTN who [facility] after developing worsening AKI on CKD and respiratory distress. Patient then with minimal UOP and worsening AKI. HD was started 1/13 and ~2 L taken off. Patient then developed worsening respiratory distress with aspiration or nectar thick tomato juice on 1/13. Intubated from 1/13 to 1/15 with self extubation. Pt had MBS on 1/13 recommending Dys 3/nectar due to immediate aspiration of thin liquids with intermittent sensation. Fluctuating arousal during exam.     Pertinent Vitals Pain Assessment: No/denies pain  SLP Plan  Discharge SLP treatment due to (comment) (Pt refusal to participate)    Recommendations Diet recommendations: Regular;Thin liquid Liquids provided via: Cup Medication Administration: Whole meds with puree Supervision: Intermittent supervision to cue for compensatory strategies Compensations: Slow rate;Small sips/bites;Clear throat intermittently Postural Changes and/or Swallow Maneuvers: Seated upright 90 degrees;Chin tuck;Out of bed for meals              Oral Care Recommendations: Oral care Q4 per protocol Follow up Recommendations: Skilled Nursing facility Plan: Discharge SLP treatment due to (comment) (Pt refusal to participate)    GO    Harlon DittyBonnie Desiraye Rolfson, MA CCC-SLP [phone]  Claudine MoutonDeBlois, Brittyn Salaz Caroline 01/02/2015, 12:19 PM

## 2015-01-02 NOTE — Progress Notes (Signed)
[facility] Rounding Note Subjective:  Feels "pretty good" Says foley is "irritating him" Improved UOP - RN just emptied 700 cc  Objective:   Vital signs in last 24 hours: Filed Vitals:   01/01/15 2122 01/01/15 2258 01/02/15 0531 01/02/15 0806  BP: 200/73 157/48 160/60 127/94  Pulse: 79  80 79  Temp: 98.5 F (36.9 C)  98.4 F (36.9 C) 98.1 F (36.7 C)  TempSrc: Oral  Oral Oral  Resp: 18  18 18   Height:      Weight:      SpO2: 97%  96% 99%   Weight change:   Intake/Output Summary (Last 24 hours) at 01/02/15 1230 Last data filed at 01/02/15 0806  Gross per 24 hour  Intake    575 ml  Output   2050 ml  Net  -1475 ml    Physical Exam:  Blood pressure 127/94, pulse 79, temperature 98.1 F (36.7 C), temperature source Oral, resp. rate 18, height 5\' 8"  (1.727 m), weight 84.641 kg (186 lb 9.6 oz), SpO2 99 %.  Rigth IJ non-tunneled HD cath with dressing in place - needs dressing change Somewhat disheveled in appearance. VS as noted Lungs with diminished BS bilaterally S1S3 No S3 + trace sacral edema,  3+ LE edema Left greater than right to the knees Multiple sores on legs   Recent Labs Lab 12/26/14 2041 12/27/14 0349 12/28/14 0535  12/29/14 1555 12/30/14 0244 12/30/14 0800 12/30/14 1639 12/31/14 0245 01/01/15 0610 01/02/15 0500  NA 137 138 131*  < > 138 137 142 139 143 146* 140  K 5.2* 5.1 5.0  < > 4.5 4.7 4.8 4.1 4.1 3.7 3.6  CL 111 112 107  < > 108 106 106 105 108 109 104  CO2 11* 12* 11*  < > 18* 19 20 23 22 20 29   GLUCOSE 293* 197* 155*  < > 109* 115* 112* 106* 80 75 178*  BUN 91* 92* 107*  < > 85* 89* 89* 54* 57* 60* 58*  CREATININE 5.95* 5.93* 6.50*  < > 5.52* 5.60* 5.52* 4.19* 4.47* 4.88* 4.65*  CALCIUM 6.3* 6.3* 6.2*  < > 6.6* 6.7* 6.9* 7.4* 7.3* 7.2* 7.3*  PHOS 9.7* 9.7* 9.4*  --   --   --  7.9*  --   --  6.4*  --   < > = values in this interval not displayed.   Recent Labs Lab 12/26/14 2041 12/27/14 0349 12/28/14 0535  12/30/14 0800  AST 14  --   --   --   ALT 14  --   --   --   ALKPHOS 45  --   --   --   BILITOT 0.5  --   --   --   PROT 5.5*  --   --   --   ALBUMIN 2.7* 2.4* 2.6* 2.3*    Recent Labs Lab 12/26/14 2041  12/30/14 0244 12/31/14 0245 01/01/15 0610 01/02/15 0500  WBC 13.7*  < > 10.0 11.3* 12.6* 17.5*  NEUTROABS 12.9*  --   --   --   --   --   HGB 9.1*  < > 8.2* 8.3* 8.5* 8.9*  HCT 28.0*  < > 24.7* 25.1* 26.3* 27.1*  MCV 87.8  < > 87.9 89.3 90.4 90.9  PLT 186  < > 141* 129* 149* 169  < > = values in this interval not displayed.  Recent Labs Lab 12/27/14 0349  CKTOTAL 620*    Recent Labs  Lab 01/01/15 1251 01/01/15 1725 01/01/15 2118 01/02/15 0802 01/02/15 1131  GLUCAP 109* 145* 141* 150* 173*     Recent Labs Lab 12/27/14 0349  IRON 35*  TIBC 205*  FERRITIN 178    Medications   . aspirin EC  325 mg Oral QPM  . calcium acetate  1,334 mg Oral TID WC  . feeding supplement (PRO-STAT SUGAR FREE 64)  30 mL Per Tube QID  . furosemide  120 mg Intravenous Q8H  . heparin  5,000 Units Subcutaneous 3 times per day  . insulin aspart  0-5 Units Subcutaneous QHS  . insulin aspart  0-9 Units Subcutaneous TID WC  . levothyroxine  50 mcg Per Tube QAC breakfast  . multivitamin  1 tablet Oral QHS  . simvastatin  20 mg Per Tube QPM  . sodium bicarbonate  650 mg Oral BID  . sodium chloride  3 mL Intravenous Q12H   Background 68yo WM with PMH significant for longstanding, poorly controlled DM, HTN, and CVA with left hemiparesis who presented to [facility] on 12/24/14 with a 1 month h/o worsening lower extremity swelling, weakness, and malaise. He was admitted to [facility] after he was noted to have anasarca, CHF, anemia, ARF and right lower lobe pneumonia. His labs were significant for a hgb 7.6, Scr of 6.1, and a CO2 of 10. He was given a blood transfusion (2 units) and started to develop worsening SOB and was transferred to [facility] for possible hemodialysis as his renal function  was not improving nor was his worsening pulmonary edema. He required HD 1/13 and 1/15.   ASSESSMENT/RECOMMENDATIONS   CKD Current stage not known.  Pt presented with creatinine of 6.1 (1.4 18 months ago), bicarb of 10, Hb of 7.6.  Renal ultrasound performed at [facility] prior to transfer revealed no hydronephrosis, Left Kidney 11.2cm, Right Kidney 11.1 with normal echogenicity. SPEP no M-spike. Urine no monoclonal free light chains.  UA with protein no blood. Calculated UPC 2.9 grams. Required dialysis 1/13 and 1/15. Fair UOP on IV lasix, creatinine appears to have peaked at 4.88 since his last HD on 1/15 and today is down to 4.65 with stable K and acid base. Unclear where he will plateau.  Will need to follow a few more days.  Would not remove HD catheter yet.  Anemia - Hb in the 8's post transfusion at [facility] prior to transfer. TSat low at 17. 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

## 2015-01-03 LAB — RENAL FUNCTION PANEL
Albumin: 2.7 g/dL — ABNORMAL LOW (ref 3.5–5.2)
Anion gap: 13 (ref 5–15)
BUN: 62 mg/dL — ABNORMAL HIGH (ref 6–23)
CALCIUM: 7 mg/dL — AB (ref 8.4–10.5)
CO2: 27 mmol/L (ref 19–32)
CREATININE: 4.63 mg/dL — AB (ref 0.50–1.35)
Chloride: 99 mEq/L (ref 96–112)
GFR calc non Af Amer: 12 mL/min — ABNORMAL LOW (ref >90)
GFR, EST AFRICAN AMERICAN: 14 mL/min — AB (ref >90)
Glucose, Bld: 124 mg/dL — ABNORMAL HIGH (ref 70–99)
PHOSPHORUS: 5.6 mg/dL — AB (ref 2.3–4.6)
Potassium: 3.6 mmol/L (ref 3.5–5.1)
Sodium: 139 mmol/L (ref 135–145)

## 2015-01-03 LAB — CBC
HCT: 26.4 % — ABNORMAL LOW (ref 39.0–52.0)
Hemoglobin: 8.6 g/dL — ABNORMAL LOW (ref 13.0–17.0)
MCH: 29.7 pg (ref 26.0–34.0)
MCHC: 32.6 g/dL (ref 30.0–36.0)
MCV: 91 fL (ref 78.0–100.0)
Platelets: 166 10*3/uL (ref 150–400)
RBC: 2.9 MIL/uL — AB (ref 4.22–5.81)
RDW: 15.2 % (ref 11.5–15.5)
WBC: 15 10*3/uL — ABNORMAL HIGH (ref 4.0–10.5)

## 2015-01-03 LAB — GLUCOSE, CAPILLARY
GLUCOSE-CAPILLARY: 121 mg/dL — AB (ref 70–99)
Glucose-Capillary: 138 mg/dL — ABNORMAL HIGH (ref 70–99)
Glucose-Capillary: 147 mg/dL — ABNORMAL HIGH (ref 70–99)
Glucose-Capillary: 128 mg/dL — ABNORMAL HIGH (ref 70–99)

## 2015-01-03 MED ORDER — FLUTICASONE PROPIONATE 50 MCG/ACT NA SUSP
2.0000 | Freq: Every day | NASAL | Status: DC
  Administered 2015-01-03 – 2015-01-07 (×4): 2 via NASAL
  Filled 2015-01-03: qty 16

## 2015-01-03 MED ORDER — LORATADINE 10 MG PO TABS
10.0000 mg | ORAL_TABLET | Freq: Every day | ORAL | Status: DC
  Administered 2015-01-03 – 2015-01-07 (×5): 10 mg via ORAL
  Filled 2015-01-03 (×5): qty 1

## 2015-01-03 MED ORDER — SODIUM CHLORIDE 0.9 % IJ SOLN
10.0000 mL | INTRAMUSCULAR | Status: DC | PRN
  Administered 2015-01-03: 10 mL

## 2015-01-03 MED ORDER — GUAIFENESIN-DM 100-10 MG/5ML PO SYRP
5.0000 mL | ORAL_SOLUTION | ORAL | Status: DC | PRN
  Administered 2015-01-05: 5 mL via ORAL
  Filled 2015-01-03 (×2): qty 5

## 2015-01-03 NOTE — Progress Notes (Addendum)
Bilateral upper extremity vein mapping attempted.  The patient states he does not want the shunt at all, so he does not want to have vein mapping done.

## 2015-01-03 NOTE — Clinical Social Work Psychosocial (Signed)
Clinical Social Work Department BRIEF PSYCHOSOCIAL ASSESSMENT 01/03/2015  Patient:  Edward [name],Edward [name]     Account Number:  [id number]     Admit date:  12/26/2014  Clinical Social Worker:  Edward [name],Edward [name],  Date/Time:  01/03/2015 12:45 PM  Referred by:  Physician  Date Referred:  01/03/2015 Referred for  SNF Placement   Other Referral:   Interview type:  Patient Other interview type:    PSYCHOSOCIAL DATA Living Status:  ALONE Admitted from facility:   Level of care:   Primary support name:  Edward [name] Primary support relationship to patient:  FAMILY Degree of support available:   Edward [name] , cousin [phone]  Edward NakayamaMarty [name], cousin [phone]    CURRENT CONCERNS Current Concerns  Post-Acute Placement   Other Concerns:    SOCIAL WORK ASSESSMENT / PLAN CSW and CSW-Intern spoke with [name] concerning physical therapy recommendation for rehab after he discharges. Safety sitter was present in the room. Patient explained that he has interest in [facility] because that is where his aunt is placed. His second preference is [facility]. CSW and CSW-Intern explained the process and estimated time for responses from facilites. Edward [name] provided CSW-Intern with support family contacts. His cousins  Edward [name] [phone] and Edward [name] [phone].   Assessment/plan status:  Psychosocial Support/Ongoing Assessment of Needs Other assessment/ plan:   Information/referral to community resources:   [facility] was provided to patient.    PATIENT'S/FAMILY'S RESPONSE TO PLAN OF CARE: Patient was pleasant and agreeable to post discharge plans.

## 2015-01-03 NOTE — Progress Notes (Signed)
[facility] Rounding Note Subjective:  Objective: Annoyed that I am waking him up because he did not sleep well last PM No c/o pain or SOB but foley "irritating him" Making good urine, creatinine is at plateau (? If this is new baseline) Refused IV Fe yesterday Has Rising WBC  Vital signs in last 24 hours: Filed Vitals:   01/02/15 0806 01/02/15 1656 01/02/15 2122 01/03/15 0621  BP: 127/94 164/68 180/82 180/62  Pulse: 79 78 81 81  Temp: 98.1 F (36.7 C) 98.1 F (36.7 C) 98.2 F (36.8 C) 98.9 F (37.2 C)  TempSrc: Oral Oral Oral Oral  Resp: 18 18 17 18   Height:      Weight:      SpO2: 99% 97% 96% 98%   Weight change:   Intake/Output Summary (Last 24 hours) at 01/03/15 1129 Last data filed at 01/03/15 1105  Gross per 24 hour  Intake    390 ml  Output   2650 ml  Net  -2260 ml    Physical Exam:  Blood pressure 180/62, pulse 81, temperature 98.9 F (37.2 C), temperature source Oral, resp. rate 18, height 5\' 8"  (1.727 m), weight 84.641 kg (186 lb 9.6 oz), SpO2 98 %.  Rigth IJ non-tunneled HD cath with dressing in place  Somewhat disheveled in appearance.  Annoyed that I woke him up VS as noted Lungs with diminished BS bilaterally S1S3 No S3 2+ LE edema Left greater than right to the knees Multiple sores on legs and hands/arms   Recent Labs Lab 12/28/14 0535  12/30/14 0244 12/30/14 0800 12/30/14 1639 12/31/14 0245 01/01/15 0610 01/02/15 0500 01/03/15 0631  NA 131*  < > 137 142 139 143 146* 140 139  K 5.0  < > 4.7 4.8 4.1 4.1 3.7 3.6 3.6  CL 107  < > 106 106 105 108 109 104 99  CO2 11*  < > 19 20 23 22 20 29 27   GLUCOSE 155*  < > 115* 112* 106* 80 75 178* 124*  BUN 107*  < > 89* 89* 54* 57* 60* 58* 62*  CREATININE 6.50*  < > 5.60* 5.52* 4.19* 4.47* 4.88* 4.65* 4.63*  CALCIUM 6.2*  < > 6.7* 6.9* 7.4* 7.3* 7.2* 7.3* 7.0*  PHOS 9.4*  --   --  7.9*  --   --  6.4*  --  5.6*  < > = values in this interval not displayed.   Recent Labs Lab  12/28/14 0535 12/30/14 0800 01/03/15 0631  ALBUMIN 2.6* 2.3* 2.7*    Recent Labs Lab 12/30/14 0244 12/31/14 0245 01/01/15 0610 01/02/15 0500  WBC 10.0 11.3* 12.6* 17.5*  HGB 8.2* 8.3* 8.5* 8.9*  HCT 24.7* 25.1* 26.3* 27.1*  MCV 87.9 89.3 90.4 90.9  PLT 141* 129* 149* 169   No results for input(s): CKTOTAL, CKMB, CKMBINDEX, TROPONINI in the last 168 hours.  Recent Labs Lab 01/02/15 0802 01/02/15 1131 01/02/15 1652 01/02/15 2215 01/03/15 0806  GLUCAP 150* 173* 237* 120* 121*    No results for input(s): IRON, TIBC, TRANSFERRIN, FERRITIN in the last 168 hours.  Medications   . aspirin EC  325 mg Oral QPM  . calcium acetate  1,334 mg Oral TID WC  . ferumoxytol  510 mg Intravenous Once  . fluticasone  2 spray Each Nare Daily  . furosemide  120 mg Intravenous Q8H  . heparin  5,000 Units Subcutaneous 3 times per day  . insulin aspart  0-5 Units Subcutaneous QHS  . insulin  aspart  0-9 Units Subcutaneous TID WC  . levothyroxine  50 mcg Per Tube QAC breakfast  . loratadine  10 mg Oral Daily  . multivitamin  1 tablet Oral QHS  . simvastatin  20 mg Per Tube QPM  . sodium bicarbonate  650 mg Oral BID  . sodium chloride  3 mL Intravenous Q12H   Background 68yo WM with PMH significant for longstanding, poorly controlled DM, HTN, and CVA with left hemiparesis who presented to [facility] on 12/24/14 with a 1 month h/o worsening lower extremity swelling, weakness, and malaise. He was admitted to [facility] after he was noted to have anasarca, CHF, anemia, ARF and right lower lobe pneumonia. His labs were significant for a hgb 7.6, Scr of 6.1, and a CO2 of 10. He was given a blood transfusion (2 units) and started to develop worsening SOB and was transferred to [facility] for possible hemodialysis as his renal function was not improving nor was his worsening pulmonary edema. He required HD 1/13 and 1/15.    ASSESSMENT/RECOMMENDATIONS   CKD Current stage not known.  Pt presented with  creatinine of 6.1 (1.4 18 months ago), bicarb of 10, Hb of 7.6.  Renal ultrasound performed at [facility] prior to transfer revealed no hydronephrosis, Left Kidney 11.2cm, Right Kidney 11.1 with normal echogenicity. SPEP no M-spike. Urine no monoclonal free light chains.  UA with protein no blood. Calculated UPC 2.9 grams. Required dialysis 1/13 and 1/15. Adequate UOP on IV lasix, creatinine has reached plateau since his last HD on 1/15 . This may be his baseline (stage 4/5) but it is not known. No indication for further HD at this time. With rising WBC would remove HD cath and foley. Agrees to vein mapping but not to HD access. Says he "might consider" in the future.  Not sure he would agree to HD in the future (and really wouldn't talk much to me about it).  Will order vein map (and hope he will allow) but will not consent to VVS eval at this time. Continue IV diuretics. He is not very accepting of or cooperative with the conversation today    Anemia - Hb in the 8's post transfusion at [facility] prior to transfer. TSat low at 17. 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 No CBC today. Pull temp HD cath and foley as above.   Camille Bal, MD [facility] [phone] Pager 01/03/2015, 11:29 AM

## 2015-01-03 NOTE — Clinical Social Work Placement (Signed)
Clinical Social Work Department CLINICAL SOCIAL WORK PLACEMENT NOTE 01/03/2015  Patient:  Edward [name],Edward [name]  Account Number:  [id number] Admit date:  12/26/2014  Clinical Social Worker:  Arrie AranZOEVENIA Paulo Keimig,  Date/time:  01/03/2015 03:51 PM  Clinical Social Work is seeking post-discharge placement for this patient at the following level of care:   SKILLED NURSING   (*CSW will update this form in Epic as items are completed)   01/03/2015  Patient/family provided with [facility] Department of Clinical Social Work's list of facilities offering this level of care within the geographic area requested by the patient (or if unable, by the patient's family).  01/03/2015  Patient/family informed of their freedom to choose among providers that offer the needed level of care, that participate in Medicare, Medicaid or managed care program needed by the patient, have an available bed and are willing to accept the patient.    Patient/family informed of [facility]' ownership interest in [facility], as well as of the fact that they are under no obligation to receive care at this facility.  PASARR submitted to EDS on 01/03/2015 PASARR number received on 01/03/2015  FL2 transmitted to all facilities in geographic area requested by pt/family on   FL2 transmitted to all facilities within larger geographic area on   Patient informed that his/her managed care company has contracts with or will negotiate with  certain facilities, including the following:     Patient/family informed of bed offers received:   Patient chooses bed at  Physician recommends and patient chooses bed at    Patient to be transferred to  on   Patient to be transferred to facility by  Patient and family notified of transfer on  Name of family member notified:    The following physician request were entered in Epic:   Additional Comments:

## 2015-01-03 NOTE — Progress Notes (Addendum)
Patient ID: Edward [name]  male  ZOX:[id number]    DOB: Jan 26, 1946    DOA: 12/26/2014  PCP: Kendell Bane, MD   Brief history of present illness  Patient is a 69 year old male with prior CVA with left-sided hemiparesis, type 2 diabetes, hypertension, hypothyroidism was taken to [facility] 2 days ago after patient was having increasing weakness with lower extremities edema. Patient was found to have elevated creatinine, low hemoglobin. Chest x-ray showed possible pneumonia. Patient also received 2 units of packed RBCs at [facility] for the anemia. His respiratory status however continued to worsen and was given Lasix and transferred to [facility] for further management. 2-D echo at [facility] showed EF 55-60% with moderate pulmonary hypertension and LVH. Renal ultrasound was normal. Hemoglobin on admission 7.6, creatinine 6.1 with bicarbonate of 13, ABG showed pH of 7.1. Patient's home medication listed that patient was on ACE inhibitor otherwise he was not aware of having kidney problems On 1/13, patient was noticed to be in respiratory distress with rhonchorous lungs, nephrology service had been following, hemodialysis was initiated on 1/13 and 2 L were taken off.  However in the evening, patient went into respiratory distress after aspirating on [place]. Critical care was consulted and patient was transferred to ICU and intubated.  Patient had repeat hemodialysis on 1/15, transferred back to the floor, TRH assumed Care on 1/17.    Assessment/Plan: Principal Problem: Acute respiratory failure with aspiration pneumonia, likely due to volume overload from acute renal failure and aspiration - Patient was extubated on 1/15, currently stable, continue O2 as needed - Followed closely by speech therapy, restarted on dysphagia 3 diet today - Per renal- with rising leukocytosis, remove HD cath and foley. Pt agrees to vein mapping (pending) but not to HD access. Continue IV diuresis -  Remains afebrile, may need antibiotics if develops fever, blood cultures 1/12 are negative so far, HIV negative, sputum culture negative. CBC still pending today. If leukocytosis worse, will place on IV Zosyn. Patient does not comply with aspiration precautions. He was recommended dysphagia 3 nectar thick diet on 1/13, he refused it to the speech therapist, he absolutely wants water and thin liquids.. Patient is alert and oriented 3 and has capacity to make decisions, hence patient was placed on regular diet. -Pt with recommendation of SNF  Acute renal failure on CKD - Hemodialysis initiated on 1/13, repeat on 1/15 - Currently on Lasix 120 mg IV q8hours, creatinine 4.63, nephrology following  Essential hypertension: Uncontrolled - Continue IV Lasix, IV hydralazine as needed  Anemia in chronic kidney disease -Hemoglobin currently stable.  S/p transfusion at [facility] prior to transfer   -Pt refused Lurlean Nanny -repeat CBC in am  Diabetes mellitus type 2, uncontrolled -Continue sliding scale insulin   Hypothyroidism - Continue Synthroid  History of stroke with left-sided hemiparesis - Continue aspirin,  statin  Acute encephalopathy - Likely metabolic encephalopathy secondary to acute renal failure, currently stable.  DVT Prophylaxis: SQ Heparin  Code Status: Full code  Family Communication:  Disposition: Pending per renal recommendations, PT evaluation commended skilled nursing facility. Discussed with patient in detail he agrees with rehabilitation, wants [facility] in [place].  Consultants:  Critical care   Nephrology  Procedures:  Hemodialysis   Antibiotics:  None  Subjective: Patient seen and examined, at the time of my examination, sitting up in the chair, denies any pain, no coughing or shortness of breath  Objective: Weight change:   Intake/Output Summary (Last 24 hours) at  01/03/15 0919 Last data filed at 01/03/15 0837  Gross per 24  hour  Intake    360 ml  Output   2650 ml  Net  -2290 ml   Blood pressure 180/62, pulse 81, temperature 98.9 F (37.2 C), temperature source Oral, resp. rate 18, height 5\' 8"  (1.727 m), weight 84.641 kg (186 lb 9.6 oz), SpO2 98 %.  Physical Exam: General: Alert and awake , oriented 3, NAD  CVS: S1-S2 clear, no murmur rubs or gallops Chest: Decreased breath sounds at the bases  Abdomen: soft nontender, nondistended, normal bowel sounds  Extremities: no cyanosis, clubbing. 1-2+ edema noted bilaterally, L>R   Lab Results: Basic Metabolic Panel:  Recent Labs Lab 01/01/15 0610 01/02/15 0500 01/03/15 0631  NA 146* 140 139  K 3.7 3.6 3.6  CL 109 104 99  CO2 20 29 27   GLUCOSE 75 178* 124*  BUN 60* 58* 62*  CREATININE 4.88* 4.65* 4.63*  CALCIUM 7.2* 7.3* 7.0*  MG 1.7  --   --   PHOS 6.4*  --  5.6*   Liver Function Tests:  Recent Labs Lab 12/30/14 0800 01/03/15 0631  ALBUMIN 2.3* 2.7*   No results for input(s): LIPASE, AMYLASE in the last 168 hours. No results for input(s): AMMONIA in the last 168 hours. CBC:  Recent Labs Lab 01/01/15 0610 01/02/15 0500  WBC 12.6* 17.5*  HGB 8.5* 8.9*  HCT 26.3* 27.1*  MCV 90.4 90.9  PLT 149* 169   Cardiac Enzymes: No results for input(s): CKTOTAL, CKMB, CKMBINDEX, TROPONINI in the last 168 hours. BNP: Invalid input(s): POCBNP CBG:  Recent Labs Lab 01/02/15 0802 01/02/15 1131 01/02/15 1652 01/02/15 2215 01/03/15 0806  GLUCAP 150* 173* 237* 120* 121*     Micro Results: Recent Results (from the past 240 hour(s))  MRSA PCR Screening     Status: None   Collection Time: 12/26/14  7:57 PM  Result Value Ref Range Status   MRSA by PCR NEGATIVE NEGATIVE Final    Comment:        The GeneXpert MRSA Assay (FDA approved for NASAL specimens only), is one component of a comprehensive MRSA colonization surveillance program. It is not intended to diagnose MRSA infection nor to guide or monitor treatment for MRSA  infections.   Culture, blood (routine x 2) Call MD if unable to obtain prior to antibiotics being given     Status: None   Collection Time: 12/27/14  5:30 AM  Result Value Ref Range Status   Specimen Description BLOOD RIGHT HAND  Final   Special Requests BOTTLES DRAWN AEROBIC ONLY 10CC  Final   Culture   Final    NO GROWTH 5 DAYS Performed at [facility]    Report Status 01/02/2015 FINAL  Final  Culture, blood (routine x 2) Call MD if unable to obtain prior to antibiotics being given     Status: None   Collection Time: 12/27/14  5:35 AM  Result Value Ref Range Status   Specimen Description BLOOD RIGHT ARM  Final   Special Requests BOTTLES DRAWN AEROBIC ONLY 10CC  Final   Culture   Final    NO GROWTH 5 DAYS Performed at [facility]    Report Status 01/02/2015 FINAL  Final  Culture, respiratory (NON-Expectorated)     Status: None   Collection Time: 12/28/14  8:11 PM  Result Value Ref Range Status   Specimen Description TRACHEAL ASPIRATE  Final   Special Requests NONE  Final   Gram  Stain   Final    NO WBC SEEN NO SQUAMOUS EPITHELIAL CELLS SEEN NO ORGANISMS SEEN Performed at [facility]    Culture   Final    Non-Pathogenic Oropharyngeal-type Flora Isolated. Performed at [facility]    Report Status 01/01/2015 FINAL  Final    Studies/Results: Portable Chest Xray  12/29/2014   CLINICAL DATA:  Respiratory failure.  EXAM: PORTABLE CHEST - 1 VIEW  COMPARISON:  12/28/2014.  FINDINGS: Interim placement of NGT. Its tip is below the left hemidiaphragm. Endotracheal tube and right IJ line in stable position. Low lung volumes with bibasilar atelectasis and/or infiltrates. No pleural effusion or pneumothorax. Stable cardiomegaly. Normal pulmonary vascularity.  IMPRESSION: 1. Interval placement NG tube, its tip is below the left hemidiaphragm. Endotracheal tube and right IJ line in stable position. 2. Low lung volumes with bibasilar atelectasis and/or  infiltrates.   Electronically Signed   By: Maisie Fus  Register   On: 12/29/2014 07:29   Dg Chest Port 1 View  12/28/2014   CLINICAL DATA:  Intubation, respiratory failure  EXAM: PORTABLE CHEST - 1 VIEW  COMPARISON:  12/28/2014  FINDINGS: Endotracheal tube 5 cm above the carina. Right IJ dialysis catheter tip SVC RA junction level. Low lung volumes noted with mild vascular congestion and basilar atelectasis. Normal heart size. No effusion or pneumothorax. Stable exam.  IMPRESSION: Endotracheal tube 5 cm above the carina.  Stable low lung volumes and vascular congestion   Electronically Signed   By: Ruel Favors M.D.   On: 12/28/2014 19:58   Dg Chest Port 1 View  12/28/2014   CLINICAL DATA:  Central line placement.  EXAM: PORTABLE CHEST - 1 VIEW  COMPARISON:  Single view of the chest 12/26/2014.  FINDINGS: Right IJ catheter is in place with the tip just within the right atrium. The tube could be withdrawn 2 cm for positioning at the superior cavoatrial junction. Lung volumes are low with right basilar airspace disease again seen. Heart size is normal. There is no pneumothorax. Right pleural effusion is noted.  IMPRESSION: Right IJ catheter tip projects within the right atrium. The line could be withdrawn 2 cm for positioning of the superior cavoatrial junction. Negative for pneumothorax.  No change in right effusion and basilar airspace disease.   Electronically Signed   By: Drusilla Kanner M.D.   On: 12/28/2014 13:41   Dg Chest Port 1 View  12/26/2014   CLINICAL DATA:  Fluid overload.  Shortness of breath.  EXAM: PORTABLE CHEST - 1 VIEW  COMPARISON:  Earlier the same day at 05:12 hr  FINDINGS: Portable AP view at 1956 hr: Low lung volumes persist. There is improving right basilar aeration with decreasing right basilar airspace disease. Unchanged left basilar airspace disease. Pulmonary vascular congestion, unchanged from prior. Cardiomediastinal contours are unchanged. Question small bilateral pleural  effusions.  IMPRESSION: Improving right basilar aeration with decreasing airspace opacity. Question small bilateral pleural effusions.   Electronically Signed   By: Rubye Oaks M.D.   On: 12/26/2014 20:37   Dg Abd Portable 1v  12/29/2014   CLINICAL DATA:  Orogastric tube placement.  Initial encounter.  EXAM: PORTABLE ABDOMEN - 1 VIEW  COMPARISON:  None.  FINDINGS: The patient's enteric tube is noted coiling at the body of the stomach and ending overlying the fundus of the stomach.  The visualized bowel gas pattern is grossly unremarkable. Contrast is seen filling the cecum, ascending colon and base of the appendix. Mild right basilar airspace opacity may  reflect atelectasis. No acute osseous abnormalities are seen.  IMPRESSION: 1. Enteric tube noted coiling at the body of the stomach and ending overlying the fundus of the stomach. 2. Mild right basilar airspace opacity may reflect atelectasis.   Electronically Signed   By: Roanna Raider M.D.   On: 12/29/2014 02:38   Dg Swallowing Func-speech Pathology  12/31/2014   Riley Nearing Deblois, CCC-SLP     12/31/2014  2:15 PM  Objective Swallowing Evaluation: Modified Barium Swallowing Study   Patient Details  Name: Edward [name] MRN: [id number] Date of Birth: [birth year]  Today's Date: 12/31/2014 Time: SLP Start Time (ACUTE ONLY): 1330-SLP Stop Time (ACUTE  ONLY): 1400 SLP Time Calculation (min) (ACUTE ONLY): 30 min  Past Medical History:  Past Medical History  Diagnosis Date  . Hypertension   . Hypothyroidism   . Diabetes mellitus without complication    Past Surgical History:  Past Surgical History  Procedure Laterality Date  . Chest tube placement     HPI:  HPI: Mr. Trostel is a 69 yo man w/ PMHx of CVA with left-sided  hemiparesis, Type 2 DM, CKD Stage 4/5 and HTN who came from  [facility] after developing worsening AKI on CKD and  respiratory distress. Patient then with minimal UOP and worsening  AKI. HD was started 1/13 and ~2 L taken off. Patient  then  developed worsening respiratory distress with aspiration or  nectar thick tomato juice on 1/13. Intubated from 1/13 to 1/15  with self extubation. Pt had MBS on 1/13 recommending Dys  3/nectar due to immediate aspiration of thin liquids with  intermittent sensation. Fluctuating arousal during exam.   No Data Recorded  Assessment / Plan / Recommendation CHL IP CLINICAL IMPRESSIONS 12/31/2014  Dysphagia Diagnosis Severe pharyngeal phase dysphagia  Clinical impression Pt presents with worsened function since last  MBS. Pt now with increased delay in swallow initiation and  increased weakness. Pt aspirating nectar thick liquids before the  swallow and honey thick liquids before/during the swallow due to  delay and decreased airway closure. Even purees and solids pose a  risk as pt has moderate oropharyngeal residuals and secretions  that mix with residuals. Pt observed to aspirate puree residuals  post swallow as it mixed with secretions. Sensation of aspirate  is significantly delayed and pt never fully clears airway.  Recommend short term alternate method of nutrition allowing pt to  recover further prior to starting a possible solids only diet.  SLP will follow for readiness.       CHL IP TREATMENT RECOMMENDATION 12/31/2014  Treatment Plan Recommendations F/U MBS in --- days (Comment)     CHL IP DIET RECOMMENDATION 12/31/2014  Diet Recommendations Alternative means - temporary  Liquid Administration via (None)  Medication Administration Via alternative means Compensations  (None)  Postural Changes and/or Swallow Maneuvers (None)     CHL IP OTHER RECOMMENDATIONS 12/31/2014  Recommended Consults (None)  Oral Care Recommendations Oral care Q4 per protocol  Other Recommendations (None)     CHL IP FOLLOW UP RECOMMENDATIONS 12/31/2014  Follow up Recommendations Skilled Nursing facility     [facility] IP FREQUENCY AND DURATION 12/31/2014  Speech Therapy Frequency (ACUTE ONLY) min 3x week  Treatment Duration 2 weeks     Pertinent  Vitals/Pain NA    SLP Swallow Goals No flowsheet data found.  No flowsheet data found.    CHL IP REASON FOR REFERRAL 12/28/2014 Reason for Referral  Objectively evaluate swallowing function     CHL  IP ORAL PHASE 12/31/2014  Lips (None)  Tongue (None)  Mucous membranes (None)  Nutritional status (None)  Other (None)  Oxygen therapy (None)  Oral Phase WFL  Oral - Pudding Teaspoon (None)  Oral - Pudding Cup (None)  Oral - Honey Teaspoon (None)  Oral - Honey Cup (None)  Oral - Honey Syringe (None)  Oral - Nectar Teaspoon (None)  Oral - Nectar Cup (None)  Oral - Nectar Straw (None)  Oral - Nectar Syringe (None)  Oral - Ice Chips (None)  Oral - Thin Teaspoon (None)  Oral - Thin Cup (None)  Oral - Thin Straw (None)  Oral - Thin Syringe (None)  Oral - Puree (None)  Oral - Mechanical Soft (None)  Oral - Regular (None)  Oral - Multi-consistency (None)  Oral - Pill (None)  Oral Phase - Comment (None)      CHL IP PHARYNGEAL PHASE 12/31/2014  Pharyngeal Phase Impaired  Pharyngeal - Pudding Teaspoon (None)  Penetration/Aspiration details (pudding teaspoon) (None)  Pharyngeal - Pudding Cup (None)  Penetration/Aspiration details (pudding cup) (None)  Pharyngeal - Honey Teaspoon Delayed swallow initiation;Reduced  airway/laryngeal closure;Reduced epiglottic  inversion;Penetration/Aspiration before  swallow;Penetration/Aspiration during swallow;Moderate  aspiration;Pharyngeal residue - valleculae;Reduced tongue base  retraction  Penetration/Aspiration details (honey teaspoon) Material enters  airway, passes BELOW cords without attempt by patient to eject  out (silent aspiration)  Pharyngeal - Honey Cup Delayed swallow initiation;Reduced  airway/laryngeal closure;Reduced epiglottic  inversion;Penetration/Aspiration before  swallow;Penetration/Aspiration during swallow;Moderate  aspiration;Pharyngeal residue - valleculae;Reduced tongue base  retraction  Penetration/Aspiration details (honey cup) Material enters  airway, passes BELOW cords  without attempt by patient to eject  out (silent aspiration)  Pharyngeal - Honey Syringe (None)  Penetration/Aspiration details (honey syringe) (None)  Pharyngeal - Nectar Teaspoon NT  Penetration/Aspiration details (nectar teaspoon) (None)  Pharyngeal - Nectar Cup Delayed swallow initiation;Reduced  airway/laryngeal closure;Reduced epiglottic  inversion;Penetration/Aspiration before swallow;Moderate  aspiration;Pharyngeal residue - valleculae;Reduced tongue base  retraction  Penetration/Aspiration details (nectar cup) Material enters  airway, passes BELOW cords and not ejected out despite cough  attempt by patient;Material enters airway, passes BELOW cords  without attempt by patient to eject out (silent aspiration)  Pharyngeal - Nectar Straw (None)  Penetration/Aspiration details (nectar straw) (None)  Pharyngeal - Nectar Syringe (None)  Penetration/Aspiration details (nectar syringe) (None)  Pharyngeal - Ice Chips (None)  Penetration/Aspiration details (ice chips) (None)  Pharyngeal - Thin Teaspoon (None)  Penetration/Aspiration details (thin teaspoon) (None)  Pharyngeal - Thin Cup NT  Penetration/Aspiration details (thin cup) (None)  Pharyngeal - Thin Straw NT  Penetration/Aspiration details (thin straw) (None)  Pharyngeal - Thin Syringe (None)  Penetration/Aspiration details (thin syringe') (None)  Pharyngeal - Puree Delayed swallow initiation;Reduced  airway/laryngeal closure;Reduced epiglottic  inversion;Penetration/Aspiration before  swallow;Penetration/Aspiration during swallow;Moderate  aspiration;Pharyngeal residue - valleculae  Penetration/Aspiration details (puree) Material enters airway,  passes BELOW cords without attempt by patient to eject out  (silent aspiration)  Pharyngeal - Mechanical Soft Pharyngeal residue -  valleculae;Reduced tongue base retraction;Reduced epiglottic  inversion;Delayed swallow initiation  Penetration/Aspiration details (mechanical soft) (None)  Pharyngeal - Regular NT   Penetration/Aspiration details (regular) (None)  Pharyngeal - Multi-consistency (None)  Penetration/Aspiration details (multi-consistency) (None)  Pharyngeal - Pill NT Penetration/Aspiration details (pill) (None)   Pharyngeal Comment chin tuck attempted with puree and honey, did  not improve function     No flowsheet data found.  No flowsheet data found.        Harlon Ditty, MA CCC-SLP [phone]  Claudine Mouton 12/31/2014, 2:12 PM  Dg Swallowing Func-speech Pathology  12/28/2014   Carolan Shiver, CCC-SLP     12/28/2014  2:04 PM Objective Swallowing Evaluation: Modified Barium Swallowing Study   Patient Details  Name: Edward [name] MRN: [id number] Date of Birth: [birth year]  Today's Date: 12/28/2014 Time: 1100-1130 SLP Time Calculation (min) (ACUTE ONLY): 30 min  Past Medical History:  Past Medical History  Diagnosis Date  . Hypertension   . Hypothyroidism   . Diabetes mellitus without complication    Past Surgical History:  Past Surgical History  Procedure Laterality Date  . Chest tube placement     HPI:  Pt is 69 y.o. 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  He reports  swallowing difficulty immediately post-stroke, but not in recent  years.  Poor performance during clinical swallow eval with  concerns for aspiration, so MBS recommended.        Assessment / Plan / Recommendation Clinical Impression  Dysphagia Diagnosis: Mild-mod pharyngeal phase dysphagia  Clinical impression: Pt presents with a mild-mod pharyngeal  dysphagia marked by premature spillage of POs into pharynx,  immediate aspiration of thin liquids (not consistently  accompanied by a cough), and mild vallecular residue of solids  post-swallow.  Pt with fluctuating alertness during study,  requiring frequent prompts to remain awake.  Compensatory  techniques were not effective due to mentation.  Recommend  initiating a dysphagia 3 diet with nectar-thick liquids; meds   crushed with puree.  SLP to follow for diet toleration and  advancement.     Treatment Recommendation  Therapy as outlined in treatment plan below    Diet Recommendation Dysphagia 3 (Mechanical Soft);Nectar-thick  liquid   Liquid Administration via: Cup Medication Administration: Crushed with puree Supervision: Full supervision/cueing for compensatory strategies Compensations: Slow rate;Small sips/bites (hold tray if coughing) Postural Changes and/or Swallow Maneuvers: Seated upright 90  degrees    Other  Recommendations Oral Care Recommendations: Oral care BID   Follow Up Recommendations   (tba)    Frequency and Duration min 3x week  2 weeks        SLP Swallow Goals     General Date of Onset: 12/26/14     Type of Study: Modified Barium Swallowing Study Reason for Referral: Objectively evaluate swallowing function Previous Swallow Assessment: clinical swallow evaluation 12/28/14 Diet Prior to this Study: NPO Temperature Spikes Noted: No Respiratory Status: Room air History of Recent Intubation: No Behavior/Cognition: Lethargic Oral Cavity - Dentition: Adequate natural dentition Self-Feeding Abilities: Able to feed self Patient Positioning: Upright in chair Baseline Vocal Quality: Wet Volitional Cough: Weak;Congested Volitional Swallow: Able to elicit Anatomy:  (presence of likely osteophytes C4-5 - no radiologist  present to confirm) Pharyngeal Secretions: Not observed secondary MBS    Reason for Referral Objectively evaluate swallowing function   Oral Phase Oral Preparation/Oral Phase Oral Phase: WFL   Pharyngeal Phase Pharyngeal Phase Pharyngeal Phase: Impaired Pharyngeal - Nectar Pharyngeal - Nectar Cup: Premature spillage to valleculae;Reduced  tongue base retraction Pharyngeal - Thin Pharyngeal - Thin Cup: Premature spillage to pyriform  sinuses;Reduced airway/laryngeal closure;Penetration/Aspiration  during swallow;Penetration/Aspiration after swallow;Trace  aspiration Penetration/Aspiration details (thin cup):  Material enters  airway, passes BELOW cords without attempt by patient to eject  out (silent aspiration);Material enters airway, passes BELOW  cords and not ejected out despite cough attempt by patient Pharyngeal - Thin Straw: Premature spillage to pyriform  sinuses;Reduced airway/laryngeal closure;Penetration/Aspiration  during swallow;Penetration/Aspiration after swallow;Trace  aspiration Penetration/Aspiration  details (thin straw): Material enters  airway, passes BELOW cords and not ejected out despite cough  attempt by patient;Material enters airway, passes BELOW cords  without attempt by patient to eject out (silent aspiration) Pharyngeal - Solids Pharyngeal - Puree: Premature spillage to valleculae;Reduced  tongue base retraction;Pharyngeal residue - valleculae Pharyngeal - Regular: Premature spillage to valleculae;Reduced  tongue base retraction;Pharyngeal residue - valleculae     Amanda L. Samson Frederic, [place] CCC/SLP Pager [phone]               Blenda Mounts Laurice 12/28/2014, 2:03 PM     Medications: Scheduled Meds: . aspirin EC  325 mg Oral QPM  . calcium acetate  1,334 mg Oral TID WC  . ferumoxytol  510 mg Intravenous Once  . fluticasone  2 spray Each Nare Daily  . furosemide  120 mg Intravenous Q8H  . heparin  5,000 Units Subcutaneous 3 times per day  . insulin aspart  0-5 Units Subcutaneous QHS  . insulin aspart  0-9 Units Subcutaneous TID WC  . levothyroxine  50 mcg Per Tube QAC breakfast  . loratadine  10 mg Oral Daily  . multivitamin  1 tablet Oral QHS  . simvastatin  20 mg Per Tube QPM  . sodium bicarbonate  650 mg Oral BID  . sodium chloride  3 mL Intravenous Q12H   Time spent: 25 mins   LOS: 8 days   Illa Level M.D. [facility] 01/03/2015, 9:19 AM Pager: [phone]  If 7PM-7AM, please contact night-coverage [email or web address] Password [email or web address]   I have personally examined the patient and reviewed the entire database. Agree with the above note and plan as outlined, any  necessary changes made in italics. Briefly 69 year old male with elevated creatinine, anemia, recent acute respiratory failure, due to aspiration and fluid overload. Started on hemodialysis on 1/13, repeated on 1/15, nephrology service following closely. Patient is somewhat noncompliant, has aspiration, refuses the dysphagia diet. Follow CBC closely. Needs SNF at discharge.  Jayshaun Phillips M.D. [facility] 01/03/2015, 12:17 PM Pager: [phone]  If 7PM-7AM, please contact night-coverage [email or web address] Password [email or web address]

## 2015-01-04 DIAGNOSIS — E8779 Other fluid overload: Secondary | ICD-10-CM

## 2015-01-04 DIAGNOSIS — E8770 Fluid overload, unspecified: Secondary | ICD-10-CM | POA: Insufficient documentation

## 2015-01-04 DIAGNOSIS — Z4659 Encounter for fitting and adjustment of other gastrointestinal appliance and device: Secondary | ICD-10-CM | POA: Insufficient documentation

## 2015-01-04 DIAGNOSIS — T17908A Unspecified foreign body in respiratory tract, part unspecified causing other injury, initial encounter: Secondary | ICD-10-CM | POA: Insufficient documentation

## 2015-01-04 DIAGNOSIS — T17998A Other foreign object in respiratory tract, part unspecified causing other injury, initial encounter: Secondary | ICD-10-CM

## 2015-01-04 LAB — GLUCOSE, CAPILLARY
GLUCOSE-CAPILLARY: 131 mg/dL — AB (ref 70–99)
GLUCOSE-CAPILLARY: 121 mg/dL — AB (ref 70–99)
Glucose-Capillary: 119 mg/dL — ABNORMAL HIGH (ref 70–99)

## 2015-01-04 LAB — CBC
HCT: 25.8 % — ABNORMAL LOW (ref 39.0–52.0)
HEMOGLOBIN: 8.4 g/dL — AB (ref 13.0–17.0)
MCH: 29.4 pg (ref 26.0–34.0)
MCHC: 32.6 g/dL (ref 30.0–36.0)
MCV: 90.2 fL (ref 78.0–100.0)
Platelets: 161 10*3/uL (ref 150–400)
RBC: 2.86 MIL/uL — AB (ref 4.22–5.81)
RDW: 15.2 % (ref 11.5–15.5)
WBC: 13.5 10*3/uL — AB (ref 4.0–10.5)

## 2015-01-04 LAB — RENAL FUNCTION PANEL
Albumin: 2.6 g/dL — ABNORMAL LOW (ref 3.5–5.2)
Anion gap: 14 (ref 5–15)
BUN: 67 mg/dL — ABNORMAL HIGH (ref 6–23)
CHLORIDE: 97 meq/L (ref 96–112)
CO2: 26 mmol/L (ref 19–32)
Calcium: 6.9 mg/dL — ABNORMAL LOW (ref 8.4–10.5)
Creatinine, Ser: 4.91 mg/dL — ABNORMAL HIGH (ref 0.50–1.35)
GFR calc Af Amer: 13 mL/min — ABNORMAL LOW (ref >90)
GFR calc non Af Amer: 11 mL/min — ABNORMAL LOW (ref >90)
Glucose, Bld: 116 mg/dL — ABNORMAL HIGH (ref 70–99)
PHOSPHORUS: 5.3 mg/dL — AB (ref 2.3–4.6)
POTASSIUM: 3.4 mmol/L — AB (ref 3.5–5.1)
SODIUM: 137 mmol/L (ref 135–145)

## 2015-01-04 MED ORDER — DARBEPOETIN ALFA 100 MCG/0.5ML IJ SOSY
100.0000 ug | PREFILLED_SYRINGE | INTRAMUSCULAR | Status: DC
  Administered 2015-01-04: 100 ug via SUBCUTANEOUS
  Filled 2015-01-04: qty 0.5

## 2015-01-04 MED ORDER — AMLODIPINE BESYLATE 5 MG PO TABS
5.0000 mg | ORAL_TABLET | Freq: Every day | ORAL | Status: DC
  Administered 2015-01-04 – 2015-01-07 (×4): 5 mg via ORAL
  Filled 2015-01-04 (×6): qty 1

## 2015-01-04 NOTE — Progress Notes (Signed)
Physical Therapy Treatment Patient Details Name: Edward SonsKenneth L Weinert MRN: [id number] DOB: February 28, 1946 Today's Date: 01/04/2015    History of Present Illness Pt is a 69 y.o. male with history of CVA and left-sided hemiparesis, diabetes mellitus type 2, hypertension and hypothyroidism was taken to [facility] 2 days PTA after patient has been having increasing weakness with lower extremity edema. Patient was found to have elevated creatinine and low hemoglobin. Patient's respiratory status got worse and transferred to [facility] for further management of the patient's worsening renal function. Patient has significant edema in the both lower extremities and has distended abdomen.     PT Comments    Pt admitted with above diagnosis. Pt currently with functional limitations due to the deficits listed below (see PT Problem List). At the time of PT eval pt was able to perform transfers and ambulation with close guard for safety as pt is impulsive. Was able to improve gait distance this session and reports mild dyspnea but moderate overall fatigue. Tolerated standing exercise well without reports of pain or SOB. Pt will benefit from skilled PT to increase their independence and safety with mobility to allow discharge to the venue listed below.     Follow Up Recommendations  SNF;Supervision/Assistance - 24 hour     Equipment Recommendations  None recommended by PT    Recommendations for Other Services       Precautions / Restrictions Precautions Precautions: Fall Precaution Comments: Residual L sided weakness from previous stroke Restrictions Weight Bearing Restrictions: No    Mobility  Bed Mobility               General bed mobility comments: Pt sitting EOB when PT arrived. Sitter present in room.   Transfers Overall transfer level: Needs assistance Equipment used: Rolling walker (2 wheeled) Transfers: Sit to/from Stand Sit to Stand: Min guard         General transfer  comment: Pt continues to be impulsive, and required increased cueing to wait for therapist prior to initiating transfer, however was able to power-up to full standing without assistance.   Ambulation/Gait Ambulation/Gait assistance: Min guard Ambulation Distance (Feet): 175 Feet Assistive device: Rolling walker (2 wheeled) Gait Pattern/deviations: Step-through pattern;Decreased stride length;Trunk flexed Gait velocity: Decreased Gait velocity interpretation: Below normal speed for age/gender General Gait Details: Pt impulsive with stopping and leaning against wall without warning or rest break or to pull pants up. Appears to be less fatigued with increased distance this session.    Stairs            Wheelchair Mobility    Modified Rankin (Stroke Patients Only)       Balance Overall balance assessment: Needs assistance Sitting-balance support: Feet supported;No upper extremity supported Sitting balance-Leahy Scale: Fair     Standing balance support: No upper extremity supported Standing balance-Leahy Scale: Poor Standing balance comment: Pt requires UE support to maintain standing balance.                     Cognition Arousal/Alertness: Awake/alert Behavior During Therapy: WFL for tasks assessed/performed Overall Cognitive Status: Within Functional Limits for tasks assessed                      Exercises General Exercises - Lower Extremity Long Arc Quad: 10 reps Hip ABduction/ADduction: 10 reps;Standing Straight Leg Raises: 10 reps;Standing Toe Raises: 10 reps;Standing Mini-Sqauts: 10 reps;Standing    General Comments        Pertinent Vitals/Pain Pain  Assessment: No/denies pain    Home Living                      Prior Function            PT Goals (current goals can now be found in the care plan section) Acute Rehab PT Goals Patient Stated Goal: Move back to [place], [place] PT Goal Formulation: With patient Time For Goal  Achievement: 01/16/15 Potential to Achieve Goals: Good Progress towards PT goals: Progressing toward goals    Frequency  Min 3X/week    PT Plan Current plan remains appropriate    Co-evaluation             End of Session Equipment Utilized During Treatment: Gait belt Activity Tolerance: Patient tolerated treatment well Patient left: in chair;with call bell/phone within reach;with nursing/sitter in room     Time: 1005-1029 PT Time Calculation (min) (ACUTE ONLY): 24 min  Charges:  $Gait Training: 8-22 mins $Therapeutic Exercise: 8-22 mins                    G Codes:      Conni Slipper 01/12/15, 10:39 AM   Conni Slipper, PT, DPT Acute Rehabilitation Services Pager: [phone]

## 2015-01-04 NOTE — Progress Notes (Addendum)
[facility] Rounding Note Subjective:  Temp cath and foley removed d/t leukocytosis WBC falling Patient refused vein mapping Seems rather insistent that his kidneys will be "healed" and says [place]" Have advised him that I  think he needs to proceed with vein mapping and permanent access He has thus far refused the vein mapping and access Not accepting yet of the ultimate need for long term HD  Objective:  Vital signs in last 24 hours: Filed Vitals:   01/03/15 1152 01/03/15 2019 01/04/15 0246 01/04/15 0647  BP: 136/56 156/61  148/60  Pulse: 81 81  81  Temp: 98 F (36.7 C) 98.6 F (37 C)  98.6 F (37 C)  TempSrc: Oral Oral  Oral  Resp: 17 18  17   Height:      Weight:  84.959 kg (187 lb 4.8 oz) 84.959 kg (187 lb 4.8 oz)   SpO2: 95% 98%  97%   Weight change:   Intake/Output Summary (Last 24 hours) at 01/04/15 1028 Last data filed at 01/04/15 0648  Gross per 24 hour  Intake    750 ml  Output    200 ml  Net    550 ml    Physical Exam:  Blood pressure 148/60, pulse 81, temperature 98.6 F (37 C), temperature source Oral, resp. rate 17, height 5\' 8"  (1.727 m), weight 84.959 kg (187 lb 4.8 oz), SpO2 97 %.  Dressing at site of IJ cath removal  Somewhat disheveled in appearance.  More interactive with me today VS as noted Lungs with diminished BS bilaterally S1S3 No S3 4+ LE edema Left greater than right to the knees, 1+ thighs Multiple sores on legs and hands/arms   Recent Labs Lab 12/30/14 0800 12/30/14 1639 12/31/14 0245 01/01/15 0610 01/02/15 0500 01/03/15 0631 01/04/15 0650  NA 142 139 143 146* 140 139 137  K 4.8 4.1 4.1 3.7 3.6 3.6 3.4*  CL 106 105 108 109 104 99 97  CO2 20 23 22 20 29 27 26   GLUCOSE 112* 106* 80 75 178* 124* 116*  BUN 89* 54* 57* 60* 58* 62* 67*  CREATININE 5.52* 4.19* 4.47* 4.88* 4.65* 4.63* 4.91*  CALCIUM 6.9* 7.4* 7.3* 7.2* 7.3* 7.0* 6.9*  PHOS 7.9*  --   --  6.4*  --   5.6* 5.3*     Recent Labs Lab 12/30/14 0800 01/03/15 0631 01/04/15 0650  ALBUMIN 2.3* 2.7* 2.6*    Recent Labs Lab 01/01/15 0610 01/02/15 0500 01/03/15 1325 01/04/15 0650  WBC 12.6* 17.5* 15.0* 13.5*  HGB 8.5* 8.9* 8.6* 8.4*  HCT 26.3* 27.1* 26.4* 25.8*  MCV 90.4 90.9 91.0 90.2  PLT 149* 169 166 161   No results for input(s): CKTOTAL, CKMB, CKMBINDEX, TROPONINI in the last 168 hours.  Recent Labs Lab 01/03/15 0806 01/03/15 1156 01/03/15 1705 01/03/15 2015 01/04/15 0742  GLUCAP 121* 138* 147* 128* 119*    No results for input(s): IRON, TIBC, TRANSFERRIN, FERRITIN in the last 168 hours.  Medications   . aspirin EC  325 mg Oral QPM  . calcium acetate  1,334 mg Oral TID WC  . ferumoxytol  510 mg Intravenous Once  . fluticasone  2 spray Each Nare Daily  . furosemide  120 mg Intravenous Q8H  . heparin  5,000 Units Subcutaneous 3 times per day  . insulin aspart  0-5 Units Subcutaneous QHS  . insulin aspart  0-9 Units Subcutaneous TID WC  . levothyroxine  50 mcg Per Tube QAC breakfast  . loratadine  10 mg Oral Daily  . multivitamin  1 tablet Oral QHS  . simvastatin  20 mg Per Tube QPM  . sodium bicarbonate  650 mg Oral BID  . sodium chloride  3 mL Intravenous Q12H   Background 68yo WM with PMH significant for longstanding, poorly controlled DM, HTN, and CVA with left hemiparesis who presented to [facility] on 12/24/14 with a 1 month h/o worsening lower extremity swelling, weakness, and malaise. He was admitted to [facility] after he was noted to have anasarca, CHF, anemia, ARF and right lower lobe pneumonia. His labs were significant for a hgb 7.6, Scr of 6.1, and a CO2 of 10. He was given a blood transfusion (2 units) and started to develop worsening SOB and was transferred to [facility] for possible hemodialysis as his renal function was not improving nor was his worsening pulmonary edema. He required HD 1/13 and 1/15.    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  Pt presented with creatinine of 6.1 (1.4 18 months ago), bicarb of 10, Hb of 7.6.  Renal ultrasound performed at [facility] prior to transfer revealed no hydronephrosis, Left Kidney 11.2cm, Right Kidney 11.1 with normal echogenicity. SPEP no M-spike. Urine no monoclonal free light chains.  UA with protein no blood. Calculated UPC 2.9 grams. Required dialysis 1/13 and 1/15. Making urine on IV lasix, creatinine has reached plateau since his last HD on 1/15 . I suspect this is his new baseline.  No indication for further acute HD at this time, but it is my opinion that he will require long term. He unfortunately refuses vein mapping and vascular access (permanent) but does say "I'll think about it". He is very unrealistic at this point. He is still very volume overloaded.  Continue IV diuretics.    Anemia - Hb in the 8's post transfusion at [facility] prior to transfer. TSat low at 17. Feraheme 510 IV X 1 ordered but pt refused. WBC is down and I would have no problem with his getting the IV Fe if he would agree. Will give a dose of Aranesp today  as well.  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   Camille Bal, MD [facility] [phone] Pager 01/04/2015, 10:28 AM

## 2015-01-04 NOTE — Progress Notes (Signed)
Patient ID: MELCHOR KIRCHGESSNER  male  ZOX:[id number]    DOB: [birth year]    DOA: 12/26/2014  PCP: Kendell Bane, MD   Brief history of present illness  Patient is a 69 year old male with prior CVA with left-sided hemiparesis, type 2 diabetes, hypertension, hypothyroidism was taken to [facility] 2 days ago after patient was having increasing weakness with lower extremities edema. Patient was found to have elevated creatinine, low hemoglobin. Chest x-ray showed possible pneumonia. Patient also received 2 units of packed RBCs at [facility] for the anemia. His respiratory status however continued to worsen and was given Lasix and transferred to [facility] for further management. 2-D echo at [facility] showed EF 55-60% with moderate pulmonary hypertension and LVH. Renal ultrasound was normal. Hemoglobin on admission 7.6, creatinine 6.1 with bicarbonate of 13, ABG showed pH of 7.1. Patient's home medication listed that patient was on ACE inhibitor otherwise he was not aware of having kidney problems On 1/13, patient was noticed to be in respiratory distress with rhonchorous lungs, nephrology service had been following, hemodialysis was initiated on 1/13 and 2 L were taken off.  However in the evening, patient went into respiratory distress after aspirating on [place]. Critical care was consulted and patient was transferred to ICU and intubated.  Patient had repeat hemodialysis on 1/15, transferred back to the floor, TRH assumed Care on 1/17.    Assessment/Plan:  Acute respiratory failure with aspiration pneumonia, likely due to volume overload from acute renal failure and aspiration -Patient was extubated on 1/15- currently stable on RA -Per Nephro- suspect creat 4.9 is new baseline. No current indication for HD, may require long term but is refusing vein mapping/vascular access currently. Continue IV diuresis.  HD/foley cath removed due to leukocytosis (trending downward)   -Speech  therapy rec's  dysphagia 3 diet, however, pt is not complying with aspiration precautions, refused recommended diet and wants water and thin liquids.  He is A & O x3 and has capacity to make decision, hence he is placed on regular diet.  -WBC trending down, at 13.5, remains afebrile. Blood cultures 1/12 are negative so far, HIV negative, sputum culture negative. -PT rec's- SNF -Continue O2 as needed  Acute renal failure on CKD - Hemodialysis initiated on 1/13, repeat on 1/15 - Nephro following- currently on Lasix 120 mg IV q8hours, creatinine 4.91   Anemia in chronic kidney disease -Hgb 8.4- stable.  S/p transfusion at [facility] prior to transfer   -Pt refusing Fareheme.  Per renal- Aranesp X1 today. -repeat CBC in am  Essential hypertension: Uncontrolled - Continue IV Lasix, IV hydralazine as needed  Diabetes mellitus type 2, uncontrolled -Hgb A1c 6.2 -Continue sliding scale insulin   Hypothyroidism - Continue Synthroid  History of stroke with left-sided hemiparesis - Continue aspirin,  statin  Acute encephalopathy - Likely metabolic encephalopathy secondary to acute renal failure, currently stable.  DVT Prophylaxis: SQ Heparin  Code Status: Full code  Family Communication:  Disposition: Pending per renal recommendations, PT evaluation commended skilled nursing facility. Discussed with patient in detail he agrees with rehabilitation, wants [facility] in [place].  Consultants:  Critical care   Nephrology  Procedures:  Hemodialysis   Antibiotics:  None  Subjective: Eating breakfast.  Denies any complaints.  States he feels like he is chocking on his saliva.  Still refuses dysphagia 3 diet.  Objective: Weight change:   Intake/Output Summary (Last 24 hours) at 01/04/15 1015 Last data filed at 01/04/15 0648  Gross per  24 hour  Intake    750 ml  Output    200 ml  Net    550 ml   Blood pressure 148/60, pulse 81, temperature 98.6 F  (37 C), temperature source Oral, resp. rate 17, height 5\' 8"  (1.727 m), weight 84.959 kg (187 lb 4.8 oz), SpO2 97 %.  Physical Exam: General: Alert and awake , oriented 3, NAD  CVS: S1-S2 clear, no murmur rubs or gallops Chest: Decreased breath sounds at the bases  Abdomen: soft nontender, nondistended, normal bowel sounds  Extremities: no cyanosis, clubbing. 1-2+ edema noted bilaterally, L>R   Lab Results: Basic Metabolic Panel:  Recent Labs Lab 01/01/15 0610  01/03/15 0631 01/04/15 0650  NA 146*  < > 139 137  K 3.7  < > 3.6 3.4*  CL 109  < > 99 97  CO2 20  < > 27 26  GLUCOSE 75  < > 124* 116*  BUN 60*  < > 62* 67*  CREATININE 4.88*  < > 4.63* 4.91*  CALCIUM 7.2*  < > 7.0* 6.9*  MG 1.7  --   --   --   PHOS 6.4*  --  5.6* 5.3*  < > = values in this interval not displayed. Liver Function Tests:  Recent Labs Lab 01/03/15 0631 01/04/15 0650  ALBUMIN 2.7* 2.6*   No results for input(s): LIPASE, AMYLASE in the last 168 hours. No results for input(s): AMMONIA in the last 168 hours. CBC:  Recent Labs Lab 01/03/15 1325 01/04/15 0650  WBC 15.0* 13.5*  HGB 8.6* 8.4*  HCT 26.4* 25.8*  MCV 91.0 90.2  PLT 166 161   Cardiac Enzymes: No results for input(s): CKTOTAL, CKMB, CKMBINDEX, TROPONINI in the last 168 hours. BNP: Invalid input(s): POCBNP CBG:  Recent Labs Lab 01/03/15 0806 01/03/15 1156 01/03/15 1705 01/03/15 2015 01/04/15 0742  GLUCAP 121* 138* 147* 128* 119*     Micro Results: Recent Results (from the past 240 hour(s))  MRSA PCR Screening     Status: None   Collection Time: 12/26/14  7:57 PM  Result Value Ref Range Status   MRSA by PCR NEGATIVE NEGATIVE Final    Comment:        The GeneXpert MRSA Assay (FDA approved for NASAL specimens only), is one component of a comprehensive MRSA colonization surveillance program. It is not intended to diagnose MRSA infection nor to guide or monitor treatment for MRSA infections.   Culture, blood  (routine x 2) Call MD if unable to obtain prior to antibiotics being given     Status: None   Collection Time: 12/27/14  5:30 AM  Result Value Ref Range Status   Specimen Description BLOOD RIGHT HAND  Final   Special Requests BOTTLES DRAWN AEROBIC ONLY 10CC  Final   Culture   Final    NO GROWTH 5 DAYS Performed at [facility]    Report Status 01/02/2015 FINAL  Final  Culture, blood (routine x 2) Call MD if unable to obtain prior to antibiotics being given     Status: None   Collection Time: 12/27/14  5:35 AM  Result Value Ref Range Status   Specimen Description BLOOD RIGHT ARM  Final   Special Requests BOTTLES DRAWN AEROBIC ONLY 10CC  Final   Culture   Final    NO GROWTH 5 DAYS Performed at [facility]    Report Status 01/02/2015 FINAL  Final  Culture, respiratory (NON-Expectorated)     Status: None   Collection  Time: 12/28/14  8:11 PM  Result Value Ref Range Status   Specimen Description TRACHEAL ASPIRATE  Final   Special Requests NONE  Final   Gram Stain   Final    NO WBC SEEN NO SQUAMOUS EPITHELIAL CELLS SEEN NO ORGANISMS SEEN Performed at [facility]    Culture   Final    Non-Pathogenic Oropharyngeal-type Flora Isolated. Performed at [facility]    Report Status 01/01/2015 FINAL  Final    Studies/Results: Portable Chest Xray  12/29/2014   CLINICAL DATA:  Respiratory failure.  EXAM: PORTABLE CHEST - 1 VIEW  COMPARISON:  12/28/2014.  FINDINGS: Interim placement of NGT. Its tip is below the left hemidiaphragm. Endotracheal tube and right IJ line in stable position. Low lung volumes with bibasilar atelectasis and/or infiltrates. No pleural effusion or pneumothorax. Stable cardiomegaly. Normal pulmonary vascularity.  IMPRESSION: 1. Interval placement NG tube, its tip is below the left hemidiaphragm. Endotracheal tube and right IJ line in stable position. 2. Low lung volumes with bibasilar atelectasis and/or infiltrates.   Electronically  Signed   By: Maisie Fus  Register   On: 12/29/2014 07:29   Dg Chest Port 1 View  12/28/2014   CLINICAL DATA:  Intubation, respiratory failure  EXAM: PORTABLE CHEST - 1 VIEW  COMPARISON:  12/28/2014  FINDINGS: Endotracheal tube 5 cm above the carina. Right IJ dialysis catheter tip SVC RA junction level. Low lung volumes noted with mild vascular congestion and basilar atelectasis. Normal heart size. No effusion or pneumothorax. Stable exam.  IMPRESSION: Endotracheal tube 5 cm above the carina.  Stable low lung volumes and vascular congestion   Electronically Signed   By: Ruel Favors M.D.   On: 12/28/2014 19:58   Dg Chest Port 1 View  12/28/2014   CLINICAL DATA:  Central line placement.  EXAM: PORTABLE CHEST - 1 VIEW  COMPARISON:  Single view of the chest 12/26/2014.  FINDINGS: Right IJ catheter is in place with the tip just within the right atrium. The tube could be withdrawn 2 cm for positioning at the superior cavoatrial junction. Lung volumes are low with right basilar airspace disease again seen. Heart size is normal. There is no pneumothorax. Right pleural effusion is noted.  IMPRESSION: Right IJ catheter tip projects within the right atrium. The line could be withdrawn 2 cm for positioning of the superior cavoatrial junction. Negative for pneumothorax.  No change in right effusion and basilar airspace disease.   Electronically Signed   By: Drusilla Kanner M.D.   On: 12/28/2014 13:41   Dg Chest Port 1 View  12/26/2014   CLINICAL DATA:  Fluid overload.  Shortness of breath.  EXAM: PORTABLE CHEST - 1 VIEW  COMPARISON:  Earlier the same day at 05:12 hr  FINDINGS: Portable AP view at 1956 hr: Low lung volumes persist. There is improving right basilar aeration with decreasing right basilar airspace disease. Unchanged left basilar airspace disease. Pulmonary vascular congestion, unchanged from prior. Cardiomediastinal contours are unchanged. Question small bilateral pleural effusions.  IMPRESSION: Improving  right basilar aeration with decreasing airspace opacity. Question small bilateral pleural effusions.   Electronically Signed   By: Rubye Oaks M.D.   On: 12/26/2014 20:37   Dg Abd Portable 1v  12/29/2014   CLINICAL DATA:  Orogastric tube placement.  Initial encounter.  EXAM: PORTABLE ABDOMEN - 1 VIEW  COMPARISON:  None.  FINDINGS: The patient's enteric tube is noted coiling at the body of the stomach and ending overlying the fundus of the  stomach.  The visualized bowel gas pattern is grossly unremarkable. Contrast is seen filling the cecum, ascending colon and base of the appendix. Mild right basilar airspace opacity may reflect atelectasis. No acute osseous abnormalities are seen.  IMPRESSION: 1. Enteric tube noted coiling at the body of the stomach and ending overlying the fundus of the stomach. 2. Mild right basilar airspace opacity may reflect atelectasis.   Electronically Signed   By: Roanna Raider M.D.   On: 12/29/2014 02:38   Dg Swallowing Func-speech Pathology  12/31/2014   Riley Nearing Deblois, CCC-SLP     12/31/2014  2:15 PM  Objective Swallowing Evaluation: Modified Barium Swallowing Study   Patient Details  Name: FREDDI SCHRAGER MRN: [id number] Date of Birth: 03/16/1946  Today's Date: 12/31/2014 Time: SLP Start Time (ACUTE ONLY): 1330-SLP Stop Time (ACUTE  ONLY): 1400 SLP Time Calculation (min) (ACUTE ONLY): 30 min  Past Medical History:  Past Medical History  Diagnosis Date  . Hypertension   . Hypothyroidism   . Diabetes mellitus without complication    Past Surgical History:  Past Surgical History  Procedure Laterality Date  . Chest tube placement     HPI:  HPI: Mr. Shorey is a 69 yo man w/ PMHx of CVA with left-sided  hemiparesis, Type 2 DM, CKD Stage 4/5 and HTN who came from  [facility] after developing worsening AKI on CKD and  respiratory distress. Patient then with minimal UOP and worsening  AKI. HD was started 1/13 and ~2 L taken off. Patient then  developed worsening  respiratory distress with aspiration or  nectar thick tomato juice on 1/13. Intubated from 1/13 to 1/15  with self extubation. Pt had MBS on 1/13 recommending Dys  3/nectar due to immediate aspiration of thin liquids with  intermittent sensation. Fluctuating arousal during exam.   No Data Recorded  Assessment / Plan / Recommendation CHL IP CLINICAL IMPRESSIONS 12/31/2014  Dysphagia Diagnosis Severe pharyngeal phase dysphagia  Clinical impression Pt presents with worsened function since last  MBS. Pt now with increased delay in swallow initiation and  increased weakness. Pt aspirating nectar thick liquids before the  swallow and honey thick liquids before/during the swallow due to  delay and decreased airway closure. Even purees and solids pose a  risk as pt has moderate oropharyngeal residuals and secretions  that mix with residuals. Pt observed to aspirate puree residuals  post swallow as it mixed with secretions. Sensation of aspirate  is significantly delayed and pt never fully clears airway.  Recommend short term alternate method of nutrition allowing pt to  recover further prior to starting a possible solids only diet.  SLP will follow for readiness.       CHL IP TREATMENT RECOMMENDATION 12/31/2014  Treatment Plan Recommendations F/U MBS in --- days (Comment)     CHL IP DIET RECOMMENDATION 12/31/2014  Diet Recommendations Alternative means - temporary  Liquid Administration via (None)  Medication Administration Via alternative means Compensations  (None)  Postural Changes and/or Swallow Maneuvers (None)     CHL IP OTHER RECOMMENDATIONS 12/31/2014  Recommended Consults (None)  Oral Care Recommendations Oral care Q4 per protocol  Other Recommendations (None)     CHL IP FOLLOW UP RECOMMENDATIONS 12/31/2014  Follow up Recommendations Skilled Nursing facility     [facility] IP FREQUENCY AND DURATION 12/31/2014  Speech Therapy Frequency (ACUTE ONLY) min 3x week  Treatment Duration 2 weeks     Pertinent Vitals/Pain NA    SLP  Swallow Goals No flowsheet  data found.  No flowsheet data found.    CHL IP REASON FOR REFERRAL 12/28/2014 Reason for Referral  Objectively evaluate swallowing function     CHL IP ORAL PHASE 12/31/2014  Lips (None)  Tongue (None)  Mucous membranes (None)  Nutritional status (None)  Other (None)  Oxygen therapy (None)  Oral Phase WFL  Oral - Pudding Teaspoon (None)  Oral - Pudding Cup (None)  Oral - Honey Teaspoon (None)  Oral - Honey Cup (None)  Oral - Honey Syringe (None)  Oral - Nectar Teaspoon (None)  Oral - Nectar Cup (None)  Oral - Nectar Straw (None)  Oral - Nectar Syringe (None)  Oral - Ice Chips (None)  Oral - Thin Teaspoon (None)  Oral - Thin Cup (None)  Oral - Thin Straw (None)  Oral - Thin Syringe (None)  Oral - Puree (None)  Oral - Mechanical Soft (None)  Oral - Regular (None)  Oral - Multi-consistency (None)  Oral - Pill (None)  Oral Phase - Comment (None)      CHL IP PHARYNGEAL PHASE 12/31/2014  Pharyngeal Phase Impaired  Pharyngeal - Pudding Teaspoon (None)  Penetration/Aspiration details (pudding teaspoon) (None)  Pharyngeal - Pudding Cup (None)  Penetration/Aspiration details (pudding cup) (None)  Pharyngeal - Honey Teaspoon Delayed swallow initiation;Reduced  airway/laryngeal closure;Reduced epiglottic  inversion;Penetration/Aspiration before  swallow;Penetration/Aspiration during swallow;Moderate  aspiration;Pharyngeal residue - valleculae;Reduced tongue base  retraction  Penetration/Aspiration details (honey teaspoon) Material enters  airway, passes BELOW cords without attempt by patient to eject  out (silent aspiration)  Pharyngeal - Honey Cup Delayed swallow initiation;Reduced  airway/laryngeal closure;Reduced epiglottic  inversion;Penetration/Aspiration before  swallow;Penetration/Aspiration during swallow;Moderate  aspiration;Pharyngeal residue - valleculae;Reduced tongue base  retraction  Penetration/Aspiration details (honey cup) Material enters  airway, passes BELOW cords without attempt by  patient to eject  out (silent aspiration)  Pharyngeal - Honey Syringe (None)  Penetration/Aspiration details (honey syringe) (None)  Pharyngeal - Nectar Teaspoon NT  Penetration/Aspiration details (nectar teaspoon) (None)  Pharyngeal - Nectar Cup Delayed swallow initiation;Reduced  airway/laryngeal closure;Reduced epiglottic  inversion;Penetration/Aspiration before swallow;Moderate  aspiration;Pharyngeal residue - valleculae;Reduced tongue base  retraction  Penetration/Aspiration details (nectar cup) Material enters  airway, passes BELOW cords and not ejected out despite cough  attempt by patient;Material enters airway, passes BELOW cords  without attempt by patient to eject out (silent aspiration)  Pharyngeal - Nectar Straw (None)  Penetration/Aspiration details (nectar straw) (None)  Pharyngeal - Nectar Syringe (None)  Penetration/Aspiration details (nectar syringe) (None)  Pharyngeal - Ice Chips (None)  Penetration/Aspiration details (ice chips) (None)  Pharyngeal - Thin Teaspoon (None)  Penetration/Aspiration details (thin teaspoon) (None)  Pharyngeal - Thin Cup NT  Penetration/Aspiration details (thin cup) (None)  Pharyngeal - Thin Straw NT  Penetration/Aspiration details (thin straw) (None)  Pharyngeal - Thin Syringe (None)  Penetration/Aspiration details (thin syringe') (None)  Pharyngeal - Puree Delayed swallow initiation;Reduced  airway/laryngeal closure;Reduced epiglottic  inversion;Penetration/Aspiration before  swallow;Penetration/Aspiration during swallow;Moderate  aspiration;Pharyngeal residue - valleculae  Penetration/Aspiration details (puree) Material enters airway,  passes BELOW cords without attempt by patient to eject out  (silent aspiration)  Pharyngeal - Mechanical Soft Pharyngeal residue -  valleculae;Reduced tongue base retraction;Reduced epiglottic  inversion;Delayed swallow initiation  Penetration/Aspiration details (mechanical soft) (None)  Pharyngeal - Regular NT  Penetration/Aspiration  details (regular) (None)  Pharyngeal - Multi-consistency (None)  Penetration/Aspiration details (multi-consistency) (None)  Pharyngeal - Pill NT Penetration/Aspiration details (pill) (None)   Pharyngeal Comment chin tuck attempted with puree and honey, did  not improve function  No flowsheet data found.  No flowsheet data found.        Harlon Ditty, MA CCC-SLP [phone]  DeBlois, Riley Nearing 12/31/2014, 2:12 PM    Dg Swallowing Func-speech Pathology  12/28/2014   Carolan Shiver, CCC-SLP     12/28/2014  2:04 PM Objective Swallowing Evaluation: Modified Barium Swallowing Study   Patient Details  Name: CORDNEY BARSTOW MRN: [id number] Date of Birth: 15-May-1946  Today's Date: 12/28/2014 Time: 1100-1130 SLP Time Calculation (min) (ACUTE ONLY): 30 min  Past Medical History:  Past Medical History  Diagnosis Date  . Hypertension   . Hypothyroidism   . Diabetes mellitus without complication    Past Surgical History:  Past Surgical History  Procedure Laterality Date  . Chest tube placement     HPI:  Pt is 69 y.o. 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  He reports  swallowing difficulty immediately post-stroke, but not in recent  years.  Poor performance during clinical swallow eval with  concerns for aspiration, so MBS recommended.        Assessment / Plan / Recommendation Clinical Impression  Dysphagia Diagnosis: Mild-mod pharyngeal phase dysphagia  Clinical impression: Pt presents with a mild-mod pharyngeal  dysphagia marked by premature spillage of POs into pharynx,  immediate aspiration of thin liquids (not consistently  accompanied by a cough), and mild vallecular residue of solids  post-swallow.  Pt with fluctuating alertness during study,  requiring frequent prompts to remain awake.  Compensatory  techniques were not effective due to mentation.  Recommend  initiating a dysphagia 3 diet with nectar-thick liquids; meds  crushed with puree.  SLP  to follow for diet toleration and  advancement.     Treatment Recommendation  Therapy as outlined in treatment plan below    Diet Recommendation Dysphagia 3 (Mechanical Soft);Nectar-thick  liquid   Liquid Administration via: Cup Medication Administration: Crushed with puree Supervision: Full supervision/cueing for compensatory strategies Compensations: Slow rate;Small sips/bites (hold tray if coughing) Postural Changes and/or Swallow Maneuvers: Seated upright 90  degrees    Other  Recommendations Oral Care Recommendations: Oral care BID   Follow Up Recommendations   (tba)    Frequency and Duration min 3x week  2 weeks        SLP Swallow Goals     General Date of Onset: 12/26/14     Type of Study: Modified Barium Swallowing Study Reason for Referral: Objectively evaluate swallowing function Previous Swallow Assessment: clinical swallow evaluation 12/28/14 Diet Prior to this Study: NPO Temperature Spikes Noted: No Respiratory Status: Room air History of Recent Intubation: No Behavior/Cognition: Lethargic Oral Cavity - Dentition: Adequate natural dentition Self-Feeding Abilities: Able to feed self Patient Positioning: Upright in chair Baseline Vocal Quality: Wet Volitional Cough: Weak;Congested Volitional Swallow: Able to elicit Anatomy:  (presence of likely osteophytes C4-5 - no radiologist  present to confirm) Pharyngeal Secretions: Not observed secondary MBS    Reason for Referral Objectively evaluate swallowing function   Oral Phase Oral Preparation/Oral Phase Oral Phase: WFL   Pharyngeal Phase Pharyngeal Phase Pharyngeal Phase: Impaired Pharyngeal - Nectar Pharyngeal - Nectar Cup: Premature spillage to valleculae;Reduced  tongue base retraction Pharyngeal - Thin Pharyngeal - Thin Cup: Premature spillage to pyriform  sinuses;Reduced airway/laryngeal closure;Penetration/Aspiration  during swallow;Penetration/Aspiration after swallow;Trace  aspiration Penetration/Aspiration details (thin cup): Material enters  airway,  passes BELOW cords without attempt by patient to eject  out (silent aspiration);Material enters airway, passes BELOW  cords and not ejected out despite cough attempt by patient Pharyngeal - Thin Straw: Premature spillage to pyriform  sinuses;Reduced airway/laryngeal closure;Penetration/Aspiration  during swallow;Penetration/Aspiration after swallow;Trace  aspiration Penetration/Aspiration details (thin straw): Material enters  airway, passes BELOW cords and not ejected out despite cough  attempt by patient;Material enters airway, passes BELOW cords  without attempt by patient to eject out (silent aspiration) Pharyngeal - Solids Pharyngeal - Puree: Premature spillage to valleculae;Reduced  tongue base retraction;Pharyngeal residue - valleculae Pharyngeal - Regular: Premature spillage to valleculae;Reduced  tongue base retraction;Pharyngeal residue - valleculae     Amanda L. Samson Frederic, [place] CCC/SLP Pager [phone]               Blenda Mounts Laurice 12/28/2014, 2:03 PM     Medications: Scheduled Meds: . aspirin EC  325 mg Oral QPM  . calcium acetate  1,334 mg Oral TID WC  . ferumoxytol  510 mg Intravenous Once  . fluticasone  2 spray Each Nare Daily  . furosemide  120 mg Intravenous Q8H  . heparin  5,000 Units Subcutaneous 3 times per day  . insulin aspart  0-5 Units Subcutaneous QHS  . insulin aspart  0-9 Units Subcutaneous TID WC  . levothyroxine  50 mcg Per Tube QAC breakfast  . loratadine  10 mg Oral Daily  . multivitamin  1 tablet Oral QHS  . simvastatin  20 mg Per Tube QPM  . sodium bicarbonate  650 mg Oral BID  . sodium chloride  3 mL Intravenous Q12H   Time spent: 25 mins   LOS: 9 days   Illa Level [facility] 01/04/2015, 10:15 AM Pager: [phone]  If 7PM-7AM, please contact night-coverage [email or web address] Password [email or web address]

## 2015-01-04 NOTE — Clinical Social Work Note (Signed)
CSW checked bed offers in care finder and no responses. [facility] and spoke with [facility] staff person who made bed offer. CSW contacted [facility] and left message for [employer] but no response as of yet. Patient advised of bed offer and that CSW will follow-up with [facility] on Thursday, 1/21.  Genelle BalVanessa Chukwuma Straus, MSW, LCSW Licensed Clinical Social Worker Clinical Social Work Department [facility] [phone]

## 2015-01-05 DIAGNOSIS — Z7189 Other specified counseling: Secondary | ICD-10-CM | POA: Insufficient documentation

## 2015-01-05 DIAGNOSIS — R0981 Nasal congestion: Secondary | ICD-10-CM | POA: Insufficient documentation

## 2015-01-05 DIAGNOSIS — Z515 Encounter for palliative care: Secondary | ICD-10-CM | POA: Insufficient documentation

## 2015-01-05 LAB — CBC
HEMATOCRIT: 23.6 % — AB (ref 39.0–52.0)
HEMOGLOBIN: 7.7 g/dL — AB (ref 13.0–17.0)
MCH: 29.2 pg (ref 26.0–34.0)
MCHC: 32.6 g/dL (ref 30.0–36.0)
MCV: 89.4 fL (ref 78.0–100.0)
PLATELETS: 166 10*3/uL (ref 150–400)
RBC: 2.64 MIL/uL — ABNORMAL LOW (ref 4.22–5.81)
RDW: 14.8 % (ref 11.5–15.5)
WBC: 10.6 10*3/uL — ABNORMAL HIGH (ref 4.0–10.5)

## 2015-01-05 LAB — RENAL FUNCTION PANEL
Albumin: 2.3 g/dL — ABNORMAL LOW (ref 3.5–5.2)
Anion gap: 13 (ref 5–15)
BUN: 69 mg/dL — ABNORMAL HIGH (ref 6–23)
CO2: 26 mmol/L (ref 19–32)
CREATININE: 5.09 mg/dL — AB (ref 0.50–1.35)
Calcium: 6.7 mg/dL — ABNORMAL LOW (ref 8.4–10.5)
Chloride: 96 mEq/L (ref 96–112)
GFR calc Af Amer: 12 mL/min — ABNORMAL LOW (ref >90)
GFR, EST NON AFRICAN AMERICAN: 11 mL/min — AB (ref >90)
GLUCOSE: 112 mg/dL — AB (ref 70–99)
Phosphorus: 5.6 mg/dL — ABNORMAL HIGH (ref 2.3–4.6)
Potassium: 4 mmol/L (ref 3.5–5.1)
Sodium: 135 mmol/L (ref 135–145)

## 2015-01-05 LAB — URIC ACID: Uric Acid, Serum: 6.1 mg/dL (ref 4.0–7.8)

## 2015-01-05 LAB — GLUCOSE, CAPILLARY
GLUCOSE-CAPILLARY: 139 mg/dL — AB (ref 70–99)
GLUCOSE-CAPILLARY: 169 mg/dL — AB (ref 70–99)
Glucose-Capillary: 100 mg/dL — ABNORMAL HIGH (ref 70–99)
Glucose-Capillary: 140 mg/dL — ABNORMAL HIGH (ref 70–99)
Glucose-Capillary: 182 mg/dL — ABNORMAL HIGH (ref 70–99)

## 2015-01-05 MED ORDER — ALLOPURINOL 100 MG PO TABS
200.0000 mg | ORAL_TABLET | Freq: Every day | ORAL | Status: DC
  Administered 2015-01-05 – 2015-01-07 (×3): 200 mg via ORAL
  Filled 2015-01-05 (×3): qty 2

## 2015-01-05 MED ORDER — METOLAZONE 2.5 MG PO TABS
2.5000 mg | ORAL_TABLET | Freq: Two times a day (BID) | ORAL | Status: DC
Start: 2015-01-05 — End: 2015-01-07
  Administered 2015-01-06 – 2015-01-07 (×3): 2.5 mg via ORAL
  Filled 2015-01-05 (×6): qty 1

## 2015-01-05 MED ORDER — OXYCODONE HCL 5 MG PO TABS: 5.0000 mg | ORAL_TABLET | Freq: Four times a day (QID) | ORAL | Status: DC | PRN

## 2015-01-05 MED ORDER — METOLAZONE 2.5 MG PO TABS
2.5000 mg | ORAL_TABLET | Freq: Every day | ORAL | Status: DC
  Administered 2015-01-05: 2.5 mg via ORAL
  Filled 2015-01-05: qty 1

## 2015-01-05 MED ORDER — CALCIUM ACETATE 667 MG PO CAPS
2001.0000 mg | ORAL_CAPSULE | Freq: Three times a day (TID) | ORAL | Status: DC
  Administered 2015-01-05 – 2015-01-07 (×7): 2001 mg via ORAL
  Filled 2015-01-05 (×8): qty 3

## 2015-01-05 MED ORDER — IBUPROFEN 400 MG PO TABS
400.0000 mg | ORAL_TABLET | Freq: Four times a day (QID) | ORAL | Status: DC | PRN
  Filled 2015-01-05: qty 1

## 2015-01-05 MED ORDER — FUROSEMIDE 80 MG PO TABS
160.0000 mg | ORAL_TABLET | Freq: Three times a day (TID) | ORAL | Status: DC
  Administered 2015-01-05 – 2015-01-07 (×5): 160 mg via ORAL
  Filled 2015-01-05 (×9): qty 2

## 2015-01-05 MED ORDER — DIPHENHYDRAMINE HCL 25 MG PO CAPS: 25.0000 mg | ORAL_CAPSULE | Freq: Four times a day (QID) | ORAL | Status: DC | PRN

## 2015-01-05 NOTE — Progress Notes (Signed)
[facility] Rounding Note Subjective:   Patient continues to maintain that his kidneys will be healed He refuses vein mapping, permanent access placement, IV iron Says his ONLY problem is his sinus drainage He is not diuresing. His edema remains massive Creatinine is around 5 and has not changed - I suspect this is his new baseline He says he has a "nephrologist friend" in [place] who he is going to get a second opinion from Says he has read that "a teaspoonful of baking soda a day" will "cure his kidneys"  Objective:  Vital signs in last 24 hours: Filed Vitals:   01/04/15 1712 01/04/15 2100 01/05/15 0500 01/05/15 0925  BP: 188/71 161/63 138/60 134/61  Pulse: 81 85 82 82  Temp: 98.8 F (37.1 C) 98.7 F (37.1 C) 99.1 F (37.3 C) 98.9 F (37.2 C)  TempSrc: Oral Oral Oral Oral  Resp: Height:      Weight:  85 kg (187 lb 6.3 oz)    SpO2: 99% 98% 98% 99%   Weight change: 0.041 kg (1.5 oz)  Intake/Output Summary (Last 24 hours) at 01/05/15 1026 Last data filed at 01/05/15 0950  Gross per 24 hour  Intake   1203 ml  Output    450 ml  Net    753 ml    Physical Exam:  Blood pressure 134/61, pulse 82, temperature 98.9 F (37.2 C), temperature source Oral, resp. rate 18, height  (1.727 m), weight 85 kg (187 lb 6.3 oz), SpO2 99 %.  Dressing at site of IJ cath removal   Interactive and very vocal about his feelings VS as noted Lungs with diminished BS bilaterally S1S3 No S3 E+ LE edema Left greater than right to the knees, 1+ thighs no change Multiple sores on legs and hands/arms   Recent Labs Lab 12/30/14 0800 12/30/14 1639 12/31/14 0245 01/01/15 0610 01/02/15 0500 01/03/15 0631 01/04/15 0650 01/05/15 0550  NA 142 139 143 146* 140 139 137 135  K 4.8 4.1 4.1 3.7 3.6 3.6 3.4* 4.0  CL 106 105 108 109 104 99 97 96  CO2 GLUCOSE 112* 106* 80 75 178* 124* 116* 112*  BUN 89* 54* 57* 60* 58* 62* 67* 69*  CREATININE  5.52* 4.19* 4.47* 4.88* 4.65* 4.63* 4.91* 5.09*  CALCIUM 6.9* 7.4* 7.3* 7.2* 7.3* 7.0* 6.9* 6.7*  PHOS 7.9*  --   --  6.4*  --  5.6* 5.3* 5.6*     Recent Labs Lab 01/03/15 0631 01/04/15 0650 01/05/15 0550  ALBUMIN 2.7* 2.6* 2.3*    Recent Labs Lab 01/02/15 0500 01/03/15 1325 01/04/15 0650 01/05/15 0550  WBC 17.5* 15.0* 13.5* 10.6*  HGB 8.9* 8.6* 8.4* 7.7*  HCT 27.1* 26.4* 25.8* 23.6*  MCV 90.9 91.0 90.2 89.4  PLT 169 166 161 166   No results for input(s): CKTOTAL, CKMB, CKMBINDEX, TROPONINI in the last 168 hours.  Recent Labs Lab 01/04/15 0742 01/04/15 1142 01/04/15 1648 01/04/15 2159 01/05/15 0741  GLUCAP 119* 131* 121* 139* 100*    No results for input(s): IRON, TIBC, TRANSFERRIN, FERRITIN in the last 168 hours.  Medications   . allopurinol  200 mg Oral Daily  . amLODipine  5 mg Oral Daily  . aspirin EC  325 mg Oral QPM  . calcium acetate  2,001 mg Oral TID WC  . darbepoetin (ARANESP) injection - NON-DIALYSIS  100 mcg Subcutaneous Q Wed-1800  . ferumoxytol  510 mg Intravenous Once  . fluticasone  2 spray Each Nare Daily  . furosemide  120 mg Intravenous Q8H  . heparin  5,000 Units Subcutaneous 3 times per day  . insulin aspart  0-5 Units Subcutaneous QHS  . insulin aspart  0-9 Units Subcutaneous TID WC  . levothyroxine  50 mcg Per Tube QAC breakfast  . loratadine  10 mg Oral Daily  . multivitamin  1 tablet Oral QHS  . simvastatin  20 mg Per Tube QPM  . sodium bicarbonate  650 mg Oral BID  . sodium chloride  3 mL Intravenous Q12H   Background 68yo WM with PMH significant for longstanding, poorly controlled DM, HTN, and CVA with left hemiparesis who [facility] with a 1 month h/o worsening lower extremity swelling, weakness, and malaise. He was admitted to [facility] after he was noted to have anasarca, CHF, anemia, ARF and right lower lobe pneumonia. His labs were significant for a hgb 7.6, Scr of 6.1, and a CO2 of 10. He was given  a blood transfusion (2 units) and started to develop worsening SOB and was transferred [facility] for possible hemodialysis as his renal function was not improving nor was his worsening pulmonary edema. He required acute HD 1/13 and 1/15. Temp cath removed because of progressive leukocytosis.   ASSESSMENT/RECOMMENDATIONS   CKD Stage at time of admission not known though my suspicion is that he is AT his current stage which is 5.  Pt presented with creatinine of 6.1 (1.4 18 months ago), bicarb of 10, Hb of 7.6. Renal [facility]  [facility] prior to transfer revealed no hydronephrosis, Left Kidney 11.2cm, Right Kidney 11.1 with normal echogenicity. SPEP no M-spike. Urine no monoclonal free light chains.  UA with protein no blood. Calculated UPC 2.9 grams. Required dialysis 1/13 and 1/15. Making urine on IV lasix, creatinine has reached plateau since his last HD on 1/15 . I am of the opinion that he needs to have a permanent access placed and in the best of worlds would be best served by getting a tunnelled catheter at the same time and starting long term dialysis. He refuses both, and states he is getting a second opinion from a nephrologist in [place]. My hands are tied as is refusing essentially any help I have to offer.  Anemia - Hb in the 8's post transfusion at [facility] prior to transfer. TSat low at 17. Feraheme 510 IV X 1 ordered but pt refused. WBC is down and I would have no problem with his getting the IV Fe if he would agree. 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 On phoslo as phosphate binder. Needs higher dose. Ordered.  DM - per primary  HTN - improving  Resp failure  - improved  Hypothyroidism  H/o CVA   Leukocytosis - WBC falling with all hardware removed.   In that he is totally unaccepting of recommendations regarding renal replacement therapy, and in part anemia management, I have little else to offer here.  My fear is that he will go  home and return quickly with the same clinical picture. We have had the same conversation now for 3 days in a row. I will change him to po diuretics, increase his phoslo - beyond that I have nothing to offer in the absence of his acceptance of recommended care and I will sign off the case.  He has his  own agenda and plans for a second opinion.  Camille Bal, MD [facility] [phone] Pager 01/05/2015, 10:26 AM

## 2015-01-05 NOTE — Progress Notes (Signed)
Chaplain visited with [name]. He noted that he was "feeling better than yesterday".   Chaplain explored some of his stories. 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 He noted that he misses the classroom. He was very eager in sharing stories from his travels, teaching history and knowledge base.   Became teary-eyed several occasions but attributed it to his stroke and a condition where he can't contain his emotions. Some of the times it arose from sensitive subjects, others not so much.    Will follow-up per pt request.   Gala RomneyBrown, Edward [name], Chaplain 01/05/2015
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
## 2015-01-05 NOTE — Progress Notes (Signed)
RN called to pt's room. Pt out of chair with front wheel walker on one side of the pt's room and IV pump and pole on the other side while still connected to it. RN asked the pt if he called for assistance before ambulating, pt stated "I can be up out of this chair any time I choose to do". RN educated the pt of the risk of ambulating without assistance, pt still refuses to call for assistance. RN assisted pt to sit on the side of the bed, pt refused to allow RN to aid any further. Bed alarm active, callbell within reach, will continue to monitor closely.

## 2015-01-05 NOTE — Progress Notes (Addendum)
Patient ID: Edward [name]  male  OZH:[id number]    DOB: Jan 18, 1946    DOA: 12/26/2014  PCP: Kendell Bane, MD   Brief history of present illness  Edward [name] is a 69 yo male with PMH of CVA with left sided hemiparesis, type 2 diabetes, hypertension, hypothyroidism, reported to [facility] with increasing weakness and lower extremity edema.  Patient was found to have elevated creatinine, low hemoglobin, and chest x-ray with possible pneumonia.  He received 2 U PRBC.  Due to worsening respiratory status, he was given Lasix and transferred to [facility]. 2-D echo at [facility] showed EF of 55-60% with moderate pulmonary hypertension and LVH.  Renal ultrasound was normal.   On admission, hemoglobin 7.6, creatinine 6.1 bicarbonate 13, ABG with PH 7.1.  On 1/13 patient with worsening respiratory status,  And hemodialysis was initiated by nephrology, 2 L were drained.  Patient went into respiratory distress after episode of aspiration, CCM was consulted, patient transferred to the ICU and intubated. 1/15, pt is extubated and had repeat hemodialysis.  He transferred back to the floor on 1/17. Patient was placed on IV diuresis, however, continues to be volume overloaded and with limited urinary output.  Patient is refusing recommendations by Nephrology of venous vein mapping for potential dialysis and any treatment for anemia, and states wants to get second nephrology opinion.  He is transitioned to oral lasix on 1/21, with addition of Metolazone.   Assessment/Plan:  Acute respiratory failure secondary to volume overload -Currently stable with 99% O2 sats on RA- pt with episode of acute respiratory failure after episode of aspiration, requiring intubation, extubated 1/15. -Nephro following- Transitioned to oral Lasix 120mg  TID.  Pt is refusing vein mapping for dialysis and states want to get second Nepho opinion.  -SLP with rec's for Dysphagia 3 diet- pt is refusing, and wants regular diet, despite  episodes of chocking.   -PT- SNF   Acute renal failure on CKD -Appears volume overloaded. -Creatinine 5.1 -Per Nephro- refusing vein mapping and eventual dialysis. Transitioned to oral Lasix.  Placed on Metolazone -S/P HD on 1/13, repeat on 1/15  Leukocytosis  -WBCs trending down, remains afebrile -HD/foley cath removed due to leukocytosis (trending downward)    -WBC trending down, at 13.5, remains afebrile. - Blood/sputum cxs- negative to date, HIV neg.   Anemia in chronic kidney disease -Hgb down 7.7 - received Aranesp X1 -Pt refusing treatment with Lurlean Nanny -repeat CBC in am  Nonmedical compliance -Pt is refusing all medical advise and states wants second Nephrology opinion  Essential hypertension: Uncontrolled - BP controlled -on Norvasc, PO Lasix, hydralazine PRN  Diabetes mellitus type 2, uncontrolled -Hgb A1c 6.2 -Continue sliding scale insulin   Hypothyroidism - Continue Synthroid  History of stroke with left-sided hemiparesis - Continue aspirin,  statin  Acute encephalopathy - Resolved- likely metabolic encephalopathy secondary to acute renal failure.  DVT Prophylaxis: SQ Heparin  Code Status: Full code  Family Communication:  Disposition: Pending per renal recommendations, PT evaluation commended skilled nursing facility. Discussed with patient in detail he agrees with rehabilitation, wants [facility] in [place].  Consultants:  Critical care   Nephrology  Procedures:  Hemodialysis   Antibiotics:  None  Subjective: Denies any complaints, states swelling is worse, but is still refusing vein mapping.  Objective: Weight change: 0.041 kg (1.5 oz)  Intake/Output Summary (Last 24 hours) at 01/05/15 1231 Last data filed at 01/05/15 0950  Gross per 24 hour  Intake   1203 ml  Output    450 ml  Net    753 ml   Blood pressure 134/61, pulse 82, temperature 98.9 F (37.2 C), temperature source Oral, resp. rate 18, height  5\' 8"  (1.727 m), weight 85 kg (187 lb 6.3 oz), SpO2 99 %.  Physical Exam: General: Alert Caucasian male in NAD CVS: S1-S2 clear, no murmur rubs or gallops Chest: Decreased breath sounds at the bases  Abdomen: soft nontender, nondistended, normal bowel sounds  Extremities: no cyanosis, clubbing. 2+ edema noted bilaterally.   Lab Results: Basic Metabolic Panel:  Recent Labs Lab 01/01/15 0610  01/04/15 0650 01/05/15 0550  NA 146*  < > 137 135  K 3.7  < > 3.4* 4.0  CL 109  < > 97 96  CO2 20  < > 26 26  GLUCOSE 75  < > 116* 112*  BUN 60*  < > 67* 69*  CREATININE 4.88*  < > 4.91* 5.09*  CALCIUM 7.2*  < > 6.9* 6.7*  MG 1.7  --   --   --   PHOS 6.4*  < > 5.3* 5.6*  < > = values in this interval not displayed. Liver Function Tests:  Recent Labs Lab 01/04/15 0650 01/05/15 0550  ALBUMIN 2.6* 2.3*   No results for input(s): LIPASE, AMYLASE in the last 168 hours. No results for input(s): AMMONIA in the last 168 hours. CBC:  Recent Labs Lab 01/04/15 0650 01/05/15 0550  WBC 13.5* 10.6*  HGB 8.4* 7.7*  HCT 25.8* 23.6*  MCV 90.2 89.4  PLT 161 166   Cardiac Enzymes: No results for input(s): CKTOTAL, CKMB, CKMBINDEX, TROPONINI in the last 168 hours. BNP: Invalid input(s): POCBNP CBG:  Recent Labs Lab 01/04/15 1142 01/04/15 1648 01/04/15 2159 01/05/15 0741 01/05/15 1219  GLUCAP 131* 121* 139* 100* 140*     Micro Results: Recent Results (from the past 240 hour(s))  MRSA PCR Screening     Status: None   Collection Time: 12/26/14  7:57 PM  Result Value Ref Range Status   MRSA by PCR NEGATIVE NEGATIVE Final    Comment:        The GeneXpert MRSA Assay (FDA approved for NASAL specimens only), is one component of a comprehensive MRSA colonization surveillance program. It is not intended to diagnose MRSA infection nor to guide or monitor treatment for MRSA infections.   Culture, blood (routine x 2) Call MD if unable to obtain prior to antibiotics being given      Status: None   Collection Time: 12/27/14  5:30 AM  Result Value Ref Range Status   Specimen Description BLOOD RIGHT HAND  Final   Special Requests BOTTLES DRAWN AEROBIC ONLY 10CC  Final   Culture   Final    NO GROWTH 5 DAYS Performed at [facility]    Report Status 01/02/2015 FINAL  Final  Culture, blood (routine x 2) Call MD if unable to obtain prior to antibiotics being given     Status: None   Collection Time: 12/27/14  5:35 AM  Result Value Ref Range Status   Specimen Description BLOOD RIGHT ARM  Final   Special Requests BOTTLES DRAWN AEROBIC ONLY 10CC  Final   Culture   Final    NO GROWTH 5 DAYS Performed at [facility]    Report Status 01/02/2015 FINAL  Final  Culture, respiratory (NON-Expectorated)     Status: None   Collection Time: 12/28/14  8:11 PM  Result Value Ref Range Status   Specimen  Description TRACHEAL ASPIRATE  Final   Special Requests NONE  Final   Gram Stain   Final    NO WBC SEEN NO SQUAMOUS EPITHELIAL CELLS SEEN NO ORGANISMS SEEN Performed at [facility]    Culture   Final    Non-Pathogenic Oropharyngeal-type Flora Isolated. Performed at [facility]    Report Status 01/01/2015 FINAL  Final    Studies/Results: Portable Chest Xray  12/29/2014   CLINICAL DATA:  Respiratory failure.  EXAM: PORTABLE CHEST - 1 VIEW  COMPARISON:  12/28/2014.  FINDINGS: Interim placement of NGT. Its tip is below the left hemidiaphragm. Endotracheal tube and right IJ line in stable position. Low lung volumes with bibasilar atelectasis and/or infiltrates. No pleural effusion or pneumothorax. Stable cardiomegaly. Normal pulmonary vascularity.  IMPRESSION: 1. Interval placement NG tube, its tip is below the left hemidiaphragm. Endotracheal tube and right IJ line in stable position. 2. Low lung volumes with bibasilar atelectasis and/or infiltrates.   Electronically Signed   By: Maisie Fus  Register   On: 12/29/2014 07:29   Dg Chest Port 1  View  12/28/2014   CLINICAL DATA:  Intubation, respiratory failure  EXAM: PORTABLE CHEST - 1 VIEW  COMPARISON:  12/28/2014  FINDINGS: Endotracheal tube 5 cm above the carina. Right IJ dialysis catheter tip SVC RA junction level. Low lung volumes noted with mild vascular congestion and basilar atelectasis. Normal heart size. No effusion or pneumothorax. Stable exam.  IMPRESSION: Endotracheal tube 5 cm above the carina.  Stable low lung volumes and vascular congestion   Electronically Signed   By: Ruel Favors M.D.   On: 12/28/2014 19:58   Dg Chest Port 1 View  12/28/2014   CLINICAL DATA:  Central line placement.  EXAM: PORTABLE CHEST - 1 VIEW  COMPARISON:  Single view of the chest 12/26/2014.  FINDINGS: Right IJ catheter is in place with the tip just within the right atrium. The tube could be withdrawn 2 cm for positioning at the superior cavoatrial junction. Lung volumes are low with right basilar airspace disease again seen. Heart size is normal. There is no pneumothorax. Right pleural effusion is noted.  IMPRESSION: Right IJ catheter tip projects within the right atrium. The line could be withdrawn 2 cm for positioning of the superior cavoatrial junction. Negative for pneumothorax.  No change in right effusion and basilar airspace disease.   Electronically Signed   By: Drusilla Kanner M.D.   On: 12/28/2014 13:41   Dg Chest Port 1 View  12/26/2014   CLINICAL DATA:  Fluid overload.  Shortness of breath.  EXAM: PORTABLE CHEST - 1 VIEW  COMPARISON:  Earlier the same day at 05:12 hr  FINDINGS: Portable AP view at 1956 hr: Low lung volumes persist. There is improving right basilar aeration with decreasing right basilar airspace disease. Unchanged left basilar airspace disease. Pulmonary vascular congestion, unchanged from prior. Cardiomediastinal contours are unchanged. Question small bilateral pleural effusions.  IMPRESSION: Improving right basilar aeration with decreasing airspace opacity. Question small  bilateral pleural effusions.   Electronically Signed   By: Rubye Oaks M.D.   On: 12/26/2014 20:37   Dg Abd Portable 1v  12/29/2014   CLINICAL DATA:  Orogastric tube placement.  Initial encounter.  EXAM: PORTABLE ABDOMEN - 1 VIEW  COMPARISON:  None.  FINDINGS: The patient's enteric tube is noted coiling at the body of the stomach and ending overlying the fundus of the stomach.  The visualized bowel gas pattern is grossly unremarkable. Contrast is seen filling  the cecum, ascending colon and base of the appendix. Mild right basilar airspace opacity may reflect atelectasis. No acute osseous abnormalities are seen.  IMPRESSION: 1. Enteric tube noted coiling at the body of the stomach and ending overlying the fundus of the stomach. 2. Mild right basilar airspace opacity may reflect atelectasis.   Electronically Signed   By: Roanna Raider M.D.   On: 12/29/2014 02:38   Dg Swallowing Func-speech Pathology  12/31/2014   Edward [name], CCC-SLP     12/31/2014  2:15 PM  Objective Swallowing Evaluation: Modified Barium Swallowing Study   Patient Details  Name: Edward [name] MRN: [id number] Date of Birth: [birth year]  Today's Date: 12/31/2014 Time: SLP Start Time (ACUTE ONLY): 1330-SLP Stop Time (ACUTE  ONLY): 1400 SLP Time Calculation (min) (ACUTE ONLY): 30 min  Past Medical History:  Past Medical History  Diagnosis Date  . Hypertension   . Hypothyroidism   . Diabetes mellitus without complication    Past Surgical History:  Past Surgical History  Procedure Laterality Date  . Chest tube placement     HPI:  HPI: Edward [name] is a 69 yo man w/ PMHx of CVA with left-sided  hemiparesis, Type 2 DM, CKD Stage 4/5 and HTN who came from  [facility] after developing worsening AKI on CKD and  respiratory distress. Patient then with minimal UOP and worsening  AKI. HD was started 1/13 and ~2 L taken off. Patient then  developed worsening respiratory distress with aspiration or  nectar thick tomato juice on 1/13.  Intubated from 1/13 to 1/15  with self extubation. Pt had MBS on 1/13 recommending Dys  3/nectar due to immediate aspiration of thin liquids with  intermittent sensation. Fluctuating arousal during exam.   No Data Recorded  Assessment / Plan / Recommendation CHL IP CLINICAL IMPRESSIONS 12/31/2014  Dysphagia Diagnosis Severe pharyngeal phase dysphagia  Clinical impression Pt presents with worsened function since last  MBS. Pt now with increased delay in swallow initiation and  increased weakness. Pt aspirating nectar thick liquids before the  swallow and honey thick liquids before/during the swallow due to  delay and decreased airway closure. Even purees and solids pose a  risk as pt has moderate oropharyngeal residuals and secretions  that mix with residuals. Pt observed to aspirate puree residuals  post swallow as it mixed with secretions. Sensation of aspirate  is significantly delayed and pt never fully clears airway.  Recommend short term alternate method of nutrition allowing pt to  recover further prior to starting a possible solids only diet.  SLP will follow for readiness.       CHL IP TREATMENT RECOMMENDATION 12/31/2014  Treatment Plan Recommendations F/U MBS in --- days (Comment)     CHL IP DIET RECOMMENDATION 12/31/2014  Diet Recommendations Alternative means - temporary  Liquid Administration via (None)  Medication Administration Via alternative means Compensations  (None)  Postural Changes and/or Swallow Maneuvers (None)     CHL IP OTHER RECOMMENDATIONS 12/31/2014  Recommended Consults (None)  Oral Care Recommendations Oral care Q4 per protocol  Other Recommendations (None)     CHL IP FOLLOW UP RECOMMENDATIONS 12/31/2014  Follow up Recommendations Skilled Nursing facility     [facility] IP FREQUENCY AND DURATION 12/31/2014  Speech Therapy Frequency (ACUTE ONLY) min 3x week  Treatment Duration 2 weeks     Pertinent Vitals/Pain NA    SLP Swallow Goals No flowsheet data found.  No flowsheet data found.    CHL IP REASON  FOR  REFERRAL 12/28/2014 Reason for Referral  Objectively evaluate swallowing function     CHL IP ORAL PHASE 12/31/2014  Lips (None)  Tongue (None)  Mucous membranes (None)  Nutritional status (None)  Other (None)  Oxygen therapy (None)  Oral Phase WFL  Oral - Pudding Teaspoon (None)  Oral - Pudding Cup (None)  Oral - Honey Teaspoon (None)  Oral - Honey Cup (None)  Oral - Honey Syringe (None)  Oral - Nectar Teaspoon (None)  Oral - Nectar Cup (None)  Oral - Nectar Straw (None)  Oral - Nectar Syringe (None)  Oral - Ice Chips (None)  Oral - Thin Teaspoon (None)  Oral - Thin Cup (None)  Oral - Thin Straw (None)  Oral - Thin Syringe (None)  Oral - Puree (None)  Oral - Mechanical Soft (None)  Oral - Regular (None)  Oral - Multi-consistency (None)  Oral - Pill (None)  Oral Phase - Comment (None)      CHL IP PHARYNGEAL PHASE 12/31/2014  Pharyngeal Phase Impaired  Pharyngeal - Pudding Teaspoon (None)  Penetration/Aspiration details (pudding teaspoon) (None)  Pharyngeal - Pudding Cup (None)  Penetration/Aspiration details (pudding cup) (None)  Pharyngeal - Honey Teaspoon Delayed swallow initiation;Reduced  airway/laryngeal closure;Reduced epiglottic  inversion;Penetration/Aspiration before  swallow;Penetration/Aspiration during swallow;Moderate  aspiration;Pharyngeal residue - valleculae;Reduced tongue base  retraction  Penetration/Aspiration details (honey teaspoon) Material enters  airway, passes BELOW cords without attempt by patient to eject  out (silent aspiration)  Pharyngeal - Honey Cup Delayed swallow initiation;Reduced  airway/laryngeal closure;Reduced epiglottic  inversion;Penetration/Aspiration before  swallow;Penetration/Aspiration during swallow;Moderate  aspiration;Pharyngeal residue - valleculae;Reduced tongue base  retraction  Penetration/Aspiration details (honey cup) Material enters  airway, passes BELOW cords without attempt by patient to eject  out (silent aspiration)  Pharyngeal - Honey Syringe (None)   Penetration/Aspiration details (honey syringe) (None)  Pharyngeal - Nectar Teaspoon NT  Penetration/Aspiration details (nectar teaspoon) (None)  Pharyngeal - Nectar Cup Delayed swallow initiation;Reduced  airway/laryngeal closure;Reduced epiglottic  inversion;Penetration/Aspiration before swallow;Moderate  aspiration;Pharyngeal residue - valleculae;Reduced tongue base  retraction  Penetration/Aspiration details (nectar cup) Material enters  airway, passes BELOW cords and not ejected out despite cough  attempt by patient;Material enters airway, passes BELOW cords  without attempt by patient to eject out (silent aspiration)  Pharyngeal - Nectar Straw (None)  Penetration/Aspiration details (nectar straw) (None)  Pharyngeal - Nectar Syringe (None)  Penetration/Aspiration details (nectar syringe) (None)  Pharyngeal - Ice Chips (None)  Penetration/Aspiration details (ice chips) (None)  Pharyngeal - Thin Teaspoon (None)  Penetration/Aspiration details (thin teaspoon) (None)  Pharyngeal - Thin Cup NT  Penetration/Aspiration details (thin cup) (None)  Pharyngeal - Thin Straw NT  Penetration/Aspiration details (thin straw) (None)  Pharyngeal - Thin Syringe (None)  Penetration/Aspiration details (thin syringe') (None)  Pharyngeal - Puree Delayed swallow initiation;Reduced  airway/laryngeal closure;Reduced epiglottic  inversion;Penetration/Aspiration before  swallow;Penetration/Aspiration during swallow;Moderate  aspiration;Pharyngeal residue - valleculae  Penetration/Aspiration details (puree) Material enters airway,  passes BELOW cords without attempt by patient to eject out  (silent aspiration)  Pharyngeal - Mechanical Soft Pharyngeal residue -  valleculae;Reduced tongue base retraction;Reduced epiglottic  inversion;Delayed swallow initiation  Penetration/Aspiration details (mechanical soft) (None)  Pharyngeal - Regular NT  Penetration/Aspiration details (regular) (None)  Pharyngeal - Multi-consistency (None)   Penetration/Aspiration details (multi-consistency) (None)  Pharyngeal - Pill NT Penetration/Aspiration details (pill) (None)   Pharyngeal Comment chin tuck attempted with puree and honey, did  not improve function     No flowsheet data found.  No flowsheet data found.  Edward Ditty, MA CCC-SLP [phone]  [name], Edward Nearing 12/31/2014, 2:12 PM    Dg Swallowing Func-speech Pathology  12/28/2014   Edward [name], CCC-SLP     12/28/2014  2:04 PM Objective Swallowing Evaluation: Modified Barium Swallowing Study   Patient Details  Name: Edward [name] MRN: [id number] Date of Birth: [birth year]  Today's Date: 12/28/2014 Time: 1100-1130 SLP Time Calculation (min) (ACUTE ONLY): 30 min  Past Medical History:  Past Medical History  Diagnosis Date  . Hypertension   . Hypothyroidism   . Diabetes mellitus without complication    Past Surgical History:  Past Surgical History  Procedure Laterality Date  . Chest tube placement     HPI:  Pt is 69 y.o. 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  He reports  swallowing difficulty immediately post-stroke, but not in recent  years.  Poor performance during clinical swallow eval with  concerns for aspiration, so MBS recommended.        Assessment / Plan / Recommendation Clinical Impression  Dysphagia Diagnosis: Mild-mod pharyngeal phase dysphagia  Clinical impression: Pt presents with a mild-mod pharyngeal  dysphagia marked by premature spillage of POs into pharynx,  immediate aspiration of thin liquids (not consistently  accompanied by a cough), and mild vallecular residue of solids  post-swallow.  Pt with fluctuating alertness during study,  requiring frequent prompts to remain awake.  Compensatory  techniques were not effective due to mentation.  Recommend  initiating a dysphagia 3 diet with nectar-thick liquids; meds  crushed with puree.  SLP to follow for diet toleration and  advancement.     Treatment  Recommendation  Therapy as outlined in treatment plan below    Diet Recommendation Dysphagia 3 (Mechanical Soft);Nectar-thick  liquid   Liquid Administration via: Cup Medication Administration: Crushed with puree Supervision: Full supervision/cueing for compensatory strategies Compensations: Slow rate;Small sips/bites (hold tray if coughing) Postural Changes and/or Swallow Maneuvers: Seated upright 90  degrees    Other  Recommendations Oral Care Recommendations: Oral care BID   Follow Up Recommendations   (tba)    Frequency and Duration min 3x week  2 weeks        SLP Swallow Goals     General Date of Onset: 12/26/14     Type of Study: Modified Barium Swallowing Study Reason for Referral: Objectively evaluate swallowing function Previous Swallow Assessment: clinical swallow evaluation 12/28/14 Diet Prior to this Study: NPO Temperature Spikes Noted: No Respiratory Status: Room air History of Recent Intubation: No Behavior/Cognition: Lethargic Oral Cavity - Dentition: Adequate natural dentition Self-Feeding Abilities: Able to feed self Patient Positioning: Upright in chair Baseline Vocal Quality: Wet Volitional Cough: Weak;Congested Volitional Swallow: Able to elicit Anatomy:  (presence of likely osteophytes C4-5 - no radiologist  present to confirm) Pharyngeal Secretions: Not observed secondary MBS    Reason for Referral Objectively evaluate swallowing function   Oral Phase Oral Preparation/Oral Phase Oral Phase: WFL   Pharyngeal Phase Pharyngeal Phase Pharyngeal Phase: Impaired Pharyngeal - Nectar Pharyngeal - Nectar Cup: Premature spillage to valleculae;Reduced  tongue base retraction Pharyngeal - Thin Pharyngeal - Thin Cup: Premature spillage to pyriform  sinuses;Reduced airway/laryngeal closure;Penetration/Aspiration  during swallow;Penetration/Aspiration after swallow;Trace  aspiration Penetration/Aspiration details (thin cup): Material enters  airway, passes BELOW cords without attempt by patient to eject  out  (silent aspiration);Material enters airway, passes BELOW  cords and not ejected out despite cough attempt by patient Pharyngeal - Thin Straw: Premature  spillage to pyriform  sinuses;Reduced airway/laryngeal closure;Penetration/Aspiration  during swallow;Penetration/Aspiration after swallow;Trace  aspiration Penetration/Aspiration details (thin straw): Material enters  airway, passes BELOW cords and not ejected out despite cough  attempt by patient;Material enters airway, passes BELOW cords  without attempt by patient to eject out (silent aspiration) Pharyngeal - Solids Pharyngeal - Puree: Premature spillage to valleculae;Reduced  tongue base retraction;Pharyngeal residue - valleculae Pharyngeal - Regular: Premature spillage to valleculae;Reduced  tongue base retraction;Pharyngeal residue - valleculae     Edward [name], [place] CCC/SLP Pager [phone]               Blenda Mounts Laurice 12/28/2014, 2:03 PM     Medications: Scheduled Meds: . allopurinol  200 mg Oral Daily  . amLODipine  5 mg Oral Daily  . aspirin EC  325 mg Oral QPM  . calcium acetate  2,001 mg Oral TID WC  . darbepoetin (ARANESP) injection - NON-DIALYSIS  100 mcg Subcutaneous Q Wed-1800  . ferumoxytol  510 mg Intravenous Once  . fluticasone  2 spray Each Nare Daily  . furosemide  160 mg Oral TID  . heparin  5,000 Units Subcutaneous 3 times per day  . insulin aspart  0-5 Units Subcutaneous QHS  . insulin aspart  0-9 Units Subcutaneous TID WC  . levothyroxine  50 mcg Per Tube QAC breakfast  . loratadine  10 mg Oral Daily  . metolazone  2.5 mg Oral Daily  . multivitamin  1 tablet Oral QHS  . simvastatin  20 mg Per Tube QPM  . sodium bicarbonate  650 mg Oral BID  . sodium chloride  3 mL Intravenous Q12H   Time spent: 25 mins   LOS: 10 days   Illa Level [facility] 01/05/2015, 12:31 PM Pager: [phone]  If 7PM-7AM, please contact night-coverage [email or web address] Password [email or web address]

## 2015-01-06 DIAGNOSIS — D631 Anemia in chronic kidney disease: Secondary | ICD-10-CM

## 2015-01-06 DIAGNOSIS — N189 Chronic kidney disease, unspecified: Secondary | ICD-10-CM

## 2015-01-06 LAB — RENAL FUNCTION PANEL
Albumin: 2.5 g/dL — ABNORMAL LOW (ref 3.5–5.2)
Anion gap: 14 (ref 5–15)
BUN: 76 mg/dL — ABNORMAL HIGH (ref 6–23)
CALCIUM: 7.1 mg/dL — AB (ref 8.4–10.5)
CHLORIDE: 95 meq/L — AB (ref 96–112)
CO2: 26 mmol/L (ref 19–32)
CREATININE: 5.51 mg/dL — AB (ref 0.50–1.35)
GFR calc Af Amer: 11 mL/min — ABNORMAL LOW (ref >90)
GFR, EST NON AFRICAN AMERICAN: 10 mL/min — AB (ref >90)
Glucose, Bld: 159 mg/dL — ABNORMAL HIGH (ref 70–99)
PHOSPHORUS: 6 mg/dL — AB (ref 2.3–4.6)
Potassium: 4.1 mmol/L (ref 3.5–5.1)
Sodium: 135 mmol/L (ref 135–145)

## 2015-01-06 LAB — CBC
HEMATOCRIT: 23.8 % — AB (ref 39.0–52.0)
Hemoglobin: 7.9 g/dL — ABNORMAL LOW (ref 13.0–17.0)
MCH: 29.8 pg (ref 26.0–34.0)
MCHC: 33.2 g/dL (ref 30.0–36.0)
MCV: 89.8 fL (ref 78.0–100.0)
Platelets: 167 10*3/uL (ref 150–400)
RBC: 2.65 MIL/uL — AB (ref 4.22–5.81)
RDW: 14.8 % (ref 11.5–15.5)
WBC: 12.4 10*3/uL — ABNORMAL HIGH (ref 4.0–10.5)

## 2015-01-06 LAB — GLUCOSE, CAPILLARY
GLUCOSE-CAPILLARY: 144 mg/dL — AB (ref 70–99)
GLUCOSE-CAPILLARY: 106 mg/dL — AB (ref 70–99)
Glucose-Capillary: 177 mg/dL — ABNORMAL HIGH (ref 70–99)
Glucose-Capillary: 214 mg/dL — ABNORMAL HIGH (ref 70–99)

## 2015-01-06 MED ORDER — METOLAZONE 2.5 MG PO TABS: 2.5000 mg | ORAL_TABLET | Freq: Two times a day (BID) | ORAL | Status: AC

## 2015-01-06 MED ORDER — SODIUM BICARBONATE 650 MG PO TABS: 650.0000 mg | ORAL_TABLET | Freq: Two times a day (BID) | ORAL | Status: AC

## 2015-01-06 MED ORDER — RENA-VITE PO TABS: 1.0000 | ORAL_TABLET | Freq: Every day | ORAL | Status: AC

## 2015-01-06 MED ORDER — ALLOPURINOL 100 MG PO TABS: 200.0000 mg | ORAL_TABLET | Freq: Every day | ORAL | Status: AC

## 2015-01-06 MED ORDER — AMLODIPINE BESYLATE 5 MG PO TABS: 5.0000 mg | ORAL_TABLET | Freq: Every day | ORAL | Status: AC

## 2015-01-06 MED ORDER — LORATADINE 10 MG PO TABS: 10.0000 mg | ORAL_TABLET | Freq: Every day | ORAL | Status: AC

## 2015-01-06 MED ORDER — CALCIUM ACETATE 667 MG PO CAPS: 2001.0000 mg | ORAL_CAPSULE | Freq: Three times a day (TID) | ORAL | Status: AC

## 2015-01-06 MED ORDER — FLUTICASONE PROPIONATE 50 MCG/ACT NA SUSP: 2.0000 | Freq: Every day | NASAL | Status: AC

## 2015-01-06 MED ORDER — FUROSEMIDE 80 MG PO TABS: 160.0000 mg | ORAL_TABLET | Freq: Three times a day (TID) | ORAL | Status: AC

## 2015-01-06 MED ORDER — DARBEPOETIN ALFA 100 MCG/0.5ML IJ SOSY: 100.0000 ug | PREFILLED_SYRINGE | INTRAMUSCULAR | Status: AC

## 2015-01-06 NOTE — Progress Notes (Signed)
oob in hall with walker no acute distress. Min assist required,

## 2015-01-06 NOTE — Progress Notes (Signed)
Pt oob, alarm sounding. On entering room found him up walking, imbalance. He stated wanted to go to br. Assisted to br and back to bed, bed alarm set. No acute distress. Pt is ware of his fall risk.

## 2015-01-06 NOTE — Care Management Note (Deleted)
CARE MANAGEMENT NOTE 01/06/2015  Patient:  Edward [name],Edward [name]   Account Number:  [id number]  Date Initiated:  01/06/2015  Documentation initiated by:  Benjamin Merrihew  Subjective/Objective Assessment:   CM following for progression and d/c planning.     Action/Plan:   01/06/2015 No d/c needs identified at this time.   Anticipated DC Date:  01/07/2015   Anticipated DC Plan:  HOME/SELF CARE         Choice offered to / List presented to:             Status of service:  Completed, signed off Medicare Important Message given?  YES (If response is "NO", the following Medicare IM given date fields will be blank) Date Medicare IM given:  01/06/2015 Medicare IM given by:  Yalanda Soderman Date Additional Medicare IM given:   Additional Medicare IM given by:    Discharge Disposition:  HOME/SELF CARE  Per UR Regulation:    If discussed at Long Length of Stay Meetings, dates discussed:    Comments:    

## 2015-01-06 NOTE — Progress Notes (Signed)
UR completed Tona Qualley K. Amisha Pospisil, RN, BSN, MSHL, CCM  01/06/2015 2:13 PM

## 2015-01-06 NOTE — Care Management Note (Signed)
CARE MANAGEMENT NOTE 01/06/2015  Patient:  Edward [name],Edward [name]   Account Number:  [id number]  Date Initiated:  12/29/2014  Documentation initiated by:  [facility]  Subjective/Objective Assessment:   [facility] - requiring dialysis. Became resp distressed and was intubated.     Action/Plan:   01/06/2015 IM given plan to d/c to SNF, CSW working with this pt.   Anticipated DC Date:  01/06/2015   Anticipated DC Plan:  SKILLED NURSING FACILITY      DC Planning Services  CM consult      Choice offered to / List presented to:             Status of service:  Completed, signed off Medicare Important Message given?  YES (If response is "NO", the following Medicare IM given date fields will be blank) Date Medicare IM given:  01/06/2015 Medicare IM given by:  Orland Visconti Date Additional Medicare IM given:   Additional Medicare IM given by:    Discharge Disposition:  SKILLED NURSING FACILITY  Per UR Regulation:  Reviewed for med. necessity/level of care/duration of stay  If discussed at Long Length of Stay Meetings, dates discussed:    Comments:  Contact:  McNeil,Robin   [phone]    McNeil,Wayne    [phone]

## 2015-01-06 NOTE — Progress Notes (Signed)
Physical Therapy Treatment Patient Details Name: Edward [name] MRN: [id number] DOB: 1946-07-22 Today's Date: 01/06/2015    History of Present Illness Pt is a 69 y.o. male with history of CVA and left-sided hemiparesis, diabetes mellitus type 2, hypertension and hypothyroidism was taken to [facility] 2 days PTA after patient has been having increasing weakness with lower extremity edema. Patient was found to have elevated creatinine and low hemoglobin. Patient's respiratory status got worse and transferred to [facility] for further management of the patient's worsening renal function. Patient has significant edema in the both lower extremities and has distended abdomen.     PT Comments    Pt progressing towards physical therapy goals. Pt was willing to perform therapeutic exercise to his pain tolerance and attempted ambulation, however states he felt limited due to gout flare up in bilateral knees. Overall pt is progressing nicely, however continue to feel that STR at the SNF level is still appropriate.   Follow Up Recommendations  SNF;Supervision/Assistance - 24 hour     Equipment Recommendations  None recommended by PT    Recommendations for Other Services       Precautions / Restrictions Precautions Precautions: Fall Precaution Comments: Residual L sided weakness from previous stroke Restrictions Weight Bearing Restrictions: No    Mobility  Bed Mobility               General bed mobility comments: Pt sitting EOB when PT arrived.  Transfers Overall transfer level: Needs assistance Equipment used: Rolling walker (2 wheeled) Transfers: Sit to/from Stand Sit to Stand: Min guard         General transfer comment: Pt continues to be slightly impulsive, and required increased cueing to wait for therapist prior to initiating transfer, however was able to power-up to full standing without assistance.   Ambulation/Gait Ambulation/Gait assistance: Min  guard Ambulation Distance (Feet): 150 Feet Assistive device: Rolling walker (2 wheeled) Gait Pattern/deviations: Step-through pattern;Decreased stride length;Trunk flexed;Narrow base of support Gait velocity: Decreased Gait velocity interpretation: Below normal speed for age/gender General Gait Details: Pt reports he feels limited due to his gout flare-up. Generally steady with RW for support, but moving very slow and guarded.    Stairs            Wheelchair Mobility    Modified Rankin (Stroke Patients Only)       Balance Overall balance assessment: Needs assistance Sitting-balance support: Feet supported;No upper extremity supported Sitting balance-Leahy Scale: Fair     Standing balance support: No upper extremity supported Standing balance-Leahy Scale: Fair Standing balance comment: Pt able to stand statically to look out the window without assist or LOB.                     Cognition Arousal/Alertness: Awake/alert Behavior During Therapy: WFL for tasks assessed/performed Overall Cognitive Status: Within Functional Limits for tasks assessed                      Exercises General Exercises - Lower Extremity Long Arc Quad: 20 reps Hip ABduction/ADduction: 15 reps    General Comments        Pertinent Vitals/Pain Pain Assessment: Faces Faces Pain Scale: Hurts little more Pain Location: Knees from gout flare    Home Living                      Prior Function            PT Goals (  current goals can now be found in the care plan section) Acute Rehab PT Goals Patient Stated Goal[place] Goal Formulation: With patient Time For Goal Achievement: 01/16/15 Potential to Achieve Goals: Good Progress towards PT goals: Progressing toward goals    Frequency  Min 3X/week    PT Plan Current plan remains appropriate    Co-evaluation             End of Session Equipment Utilized During Treatment: Gait  belt Activity Tolerance: Patient tolerated treatment well Patient left: in bed;with bed alarm set     Time: [id number] PT Time Calculation (min) (ACUTE ONLY): 24 min  Charges:  $Gait Training: 8-22 mins $Therapeutic Exercise: 8-22 mins                    G Codes:      Conni SlipperKirkman, Kenley Rettinger 01/06/2015, 10:22 AM   Conni SlipperLaura Linzy Laury, PT, DPT Acute Rehabilitation Services Pager: [phone]

## 2015-01-06 NOTE — Discharge Summary (Signed)
Physician Discharge Summary  Edward [name] ZOX:[id number] DOB: Feb 02, 1946 DOA: 12/26/2014  PCP: Kendell Bane, MD  Admit date: 12/26/2014 Discharge date: 01/06/2015  Time spent: 50 minutes  Recommendations for Outpatient Follow-up:  -Will be discharged to SNF today. -Please note that he is refusing treatment, mainly for his deteriorating renal function. States he has a "nephrologist friend" in [place], [place] and he has said that his kidneys have a chance at recovering and as such is refusing vein mapping, placement of fistula and continued treatment.   Discharge Diagnoses:  Principal Problem:   Acute renal failure Active Problems:   Essential hypertension   Anemia in chronic kidney disease   Diabetes mellitus type 2, uncontrolled   Hypothyroidism   History of stroke   Aspiration into airway   Encounter for orogastric (OG) tube placement   Fluid overload   Palliative care encounter   Sinus congestion   Goals of care, counseling/discussion   Discharge Condition: Guarded  [facility] Weights   01/04/15 0246 01/04/15 2100 01/05/15 2055  Weight: 84.959 kg (187 lb 4.8 oz) 85 kg (187 lb 6.3 oz) 84.4 kg (186 lb 1.1 oz)    History of present illness:  Edward [name] is a 69 y.o. male with history of CVA and left-sided hemiparesis, diabetes mellitus type 2, hypertension and hypothyroidism was taken to [facility] 2 days ago after patient has been having increasing weakness with lower extremity edema. Patient was found to have elevated creatinine and low hemoglobin. His textile also was showing which is concerning for pneumonia. Patient did receive 2 units of PRBC at [facility] for the anemia. Patient's respiratory status got worse and and was given Lasix and transferred to [facility] for further management of the patient's worsening renal function. 2-D echo done at [facility] showed EF of 55-60% with moderate pulmonary hypertension and concentrate LVH. Renal sonogram  showed no hydronephrosis. Hemoglobin on admission was 7.6. Creatinine was 6.1 with a bicarbonate of 13 and ABG showing a pH of 7.1. Patient states he is not aware of patient having any kidney problems. Patient's home medication list shows that patient is on ACE inhibitor. On exam patient is not in distress but does have gurgling breath sounds. Patient has significant edema in the both lower extremities and has distended abdomen He was admitted to the hospital and started on IV antibiotics, On 1/13, patient was noticed to be in respiratory distress with rhonchorous lungs, nephrology service had been following, hemodialysis was initiated on 1/13 and 2 L were taken off. However in the evening, patient went into respiratory distress after aspirating on Supper. Critical care was consulted and patient was transferred to ICU and intubated. Patient had repeat hemodialysis on 1/15, transferred back to the floor, TRH assumed Care on 1/17. HD cath was removed  Hospital Course:   Acute Respiratory Failure -Resolved. -No current oxygen requirements. -Presumed 2/2 aspiration and volume overload with worsening kidney failure. -Was intubated and extubated on 1/15.  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 -He refuses further medical management stating that he believes his kidneys will be "healed" and plans to follow up with a nephrologist friend of his in [place], [place]. - On day of DC his Cr has increased to 5.51 from 5.0. When I have communicated this to him saying that I believe he should reconsider his dialysis options, he simply states that he is tired of "hearing from  all these doctors of doom" and that he will take his chances. -Has been seen by palliative care medicine, who agrees that he has medical decision capacity. -Continue PO lasix at 160 mg TID. Unfortunately, he remains markedly edematous on DC. -Will be  going to SNF on DC for rehab per PT recommendations.  Anemia of Chronic Disease -2/2 CKD Stage V. -Aranesp q weekly.  Hypothyroidism -Continue synthroid.  H/o CVA -Continue ASA/statin.  DM -Continue SSI. -May need to adjust as an OP based on CBG readings.   Consultations:  Renal  CCM  Discharge Instructions  Discharge Instructions    Diet - low sodium heart healthy    Complete by:  As directed      Increase activity slowly    Complete by:  As directed             Medication List    STOP taking these medications        ACIDOPHILUS PO     atenolol 50 MG tablet  Commonly known as:  TENORMIN     benazepril 40 MG tablet  Commonly known as:  LOTENSIN     LANTUS SOLOSTAR 100 UNIT/ML Solostar Pen  Generic drug:  Insulin Glargine     OVER THE COUNTER MEDICATION      TAKE these medications        allopurinol 100 MG tablet  Commonly known as:  ZYLOPRIM  Take 2 tablets (200 mg total) by mouth daily.     amLODipine 5 MG tablet  Commonly known as:  NORVASC  Take 1 tablet (5 mg total) by mouth daily.     aspirin EC 325 MG tablet  Take 325 mg by mouth every evening.     calcium acetate 667 MG capsule  Commonly known as:  PHOSLO  Take 3 capsules (2,001 mg total) by mouth 3 (three) times daily with meals.     Darbepoetin Alfa 100 MCG/0.5ML Sosy injection  Commonly known as:  ARANESP  Inject 0.5 mLs (100 mcg total) into the skin every Wednesday at 6 PM.     fluticasone 50 MCG/ACT nasal spray  Commonly known as:  FLONASE  Place 2 sprays into both nostrils daily.     furosemide 80 MG tablet  Commonly known as:  LASIX  Take 2 tablets (160 mg total) by mouth 3 (three) times daily.     levothyroxine 50 MCG tablet  Commonly known as:  SYNTHROID, LEVOTHROID  Take 50 mcg by mouth every evening.     loratadine 10 MG tablet  Commonly known as:  CLARITIN  Take 1 tablet (10 mg total) by mouth daily.     metolazone 2.5 MG tablet  Commonly known as:  ZAROXOLYN   Take 1 tablet (2.5 mg total) by mouth every 12 (twelve) hours.     multivitamin Tabs tablet  Take 1 tablet by mouth at bedtime.     NOVOLOG FLEXPEN 100 UNIT/ML FlexPen  Generic drug:  insulin aspart  Inject 2-10 Units into the skin daily with supper. Per sliding scale     simvastatin 20 MG tablet  Commonly known as:  ZOCOR  Take 20 mg by mouth every evening.     sodium bicarbonate 650 MG tablet  Take 1 tablet (650 mg total) by mouth 2 (two) times daily.       No Known Allergies    The results of significant diagnostics from this hospitalization (including imaging, microbiology, ancillary and laboratory) are listed below for reference.  Significant Diagnostic Studies: Portable Chest Xray  12/29/2014   CLINICAL DATA:  Respiratory failure.  EXAM: PORTABLE CHEST - 1 VIEW  COMPARISON:  12/28/2014.  FINDINGS: Interim placement of NGT. Its tip is below the left hemidiaphragm. Endotracheal tube and right IJ line in stable position. Low lung volumes with bibasilar atelectasis and/or infiltrates. No pleural effusion or pneumothorax. Stable cardiomegaly. Normal pulmonary vascularity.  IMPRESSION: 1. Interval placement NG tube, its tip is below the left hemidiaphragm. Endotracheal tube and right IJ line in stable position. 2. Low lung volumes with bibasilar atelectasis and/or infiltrates.   Electronically Signed   By: Maisie Fushomas  Register   On: 12/29/2014 07:29   Dg Chest Port 1 View  12/28/2014   CLINICAL DATA:  Intubation, respiratory failure  EXAM: PORTABLE CHEST - 1 VIEW  COMPARISON:  12/28/2014  FINDINGS: Endotracheal tube 5 cm above the carina. Right IJ dialysis catheter tip SVC RA junction level. Low lung volumes noted with mild vascular congestion and basilar atelectasis. Normal heart size. No effusion or pneumothorax. Stable exam.  IMPRESSION: Endotracheal tube 5 cm above the carina.  Stable low lung volumes and vascular congestion   Electronically Signed   By: Ruel Favorsrevor  Shick M.D.   On:  12/28/2014 19:58   Dg Chest Port 1 View  12/28/2014   CLINICAL DATA:  Central line placement.  EXAM: PORTABLE CHEST - 1 VIEW  COMPARISON:  Single view of the chest 12/26/2014.  FINDINGS: Right IJ catheter is in place with the tip just within the right atrium. The tube could be withdrawn 2 cm for positioning at the superior cavoatrial junction. Lung volumes are low with right basilar airspace disease again seen. Heart size is normal. There is no pneumothorax. Right pleural effusion is noted.  IMPRESSION: Right IJ catheter tip projects within the right atrium. The line could be withdrawn 2 cm for positioning of the superior cavoatrial junction. Negative for pneumothorax.  No change in right effusion and basilar airspace disease.   Electronically Signed   By: Drusilla Kannerhomas  Dalessio M.D.   On: 12/28/2014 13:41   Dg Chest Port 1 View  12/26/2014   CLINICAL DATA:  Fluid overload.  Shortness of breath.  EXAM: PORTABLE CHEST - 1 VIEW  COMPARISON:  Earlier the same day at 05:12 hr  FINDINGS: Portable AP view at 1956 hr: Low lung volumes persist. There is improving right basilar aeration with decreasing right basilar airspace disease. Unchanged left basilar airspace disease. Pulmonary vascular congestion, unchanged from prior. Cardiomediastinal contours are unchanged. Question small bilateral pleural effusions.  IMPRESSION: Improving right basilar aeration with decreasing airspace opacity. Question small bilateral pleural effusions.   Electronically Signed   By: Rubye OaksMelanie  Ehinger M.D.   On: 12/26/2014 20:37   Dg Abd Portable 1v  12/29/2014   CLINICAL DATA:  Orogastric tube placement.  Initial encounter.  EXAM: PORTABLE ABDOMEN - 1 VIEW  COMPARISON:  None.  FINDINGS: The patient's enteric tube is noted coiling at the body of the stomach and ending overlying the fundus of the stomach.  The visualized bowel gas pattern is grossly unremarkable. Contrast is seen filling the cecum, ascending colon and base of the appendix. Mild  right basilar airspace opacity may reflect atelectasis. No acute osseous abnormalities are seen.  IMPRESSION: 1. Enteric tube noted coiling at the body of the stomach and ending overlying the fundus of the stomach. 2. Mild right basilar airspace opacity may reflect atelectasis.   Electronically Signed   By: Roanna RaiderJeffery  Chang M.D.   On:  12/29/2014 02:38   Dg Swallowing Func-speech Pathology  12/31/2014   Riley Nearing Deblois, CCC-SLP     12/31/2014  2:15 PM  Objective Swallowing Evaluation: Modified Barium Swallowing Study   Patient Details  Name: Edward [name] MRN: [id number] Date of Birth: 1946/07/11  Today's Date: 12/31/2014 Time: SLP Start Time (ACUTE ONLY): 1330-SLP Stop Time (ACUTE  ONLY): 1400 SLP Time Calculation (min) (ACUTE ONLY): 30 min  Past Medical History:  Past Medical History  Diagnosis Date  . Hypertension   . Hypothyroidism   . Diabetes mellitus without complication    Past Surgical History:  Past Surgical History  Procedure Laterality Date  . Chest tube placement     HPI:  HPI: Mr. Dolata is a 69 yo man w/ PMHx of CVA with left-sided  hemiparesis, Type 2 DM, CKD Stage 4/5 and HTN who came from  [facility] after developing worsening AKI on CKD and  respiratory distress. Patient then with minimal UOP and worsening  AKI. HD was started 1/13 and ~2 L taken off. Patient then  developed worsening respiratory distress with aspiration or  nectar thick tomato juice on 1/13. Intubated from 1/13 to 1/15  with self extubation. Pt had MBS on 1/13 recommending Dys  3/nectar due to immediate aspiration of thin liquids with  intermittent sensation. Fluctuating arousal during exam.   No Data Recorded  Assessment / Plan / Recommendation CHL IP CLINICAL IMPRESSIONS 12/31/2014  Dysphagia Diagnosis Severe pharyngeal phase dysphagia  Clinical impression Pt presents with worsened function since last  MBS. Pt now with increased delay in swallow initiation and  increased weakness. Pt aspirating nectar thick liquids  before the  swallow and honey thick liquids before/during the swallow due to  delay and decreased airway closure. Even purees and solids pose a  risk as pt has moderate oropharyngeal residuals and secretions  that mix with residuals. Pt observed to aspirate puree residuals  post swallow as it mixed with secretions. Sensation of aspirate  is significantly delayed and pt never fully clears airway.  Recommend short term alternate method of nutrition allowing pt to  recover further prior to starting a possible solids only diet.  SLP will follow for readiness.       CHL IP TREATMENT RECOMMENDATION 12/31/2014  Treatment Plan Recommendations F/U MBS in --- days (Comment)     CHL IP DIET RECOMMENDATION 12/31/2014  Diet Recommendations Alternative means - temporary  Liquid Administration via (None)  Medication Administration Via alternative means Compensations  (None)  Postural Changes and/or Swallow Maneuvers (None)     CHL IP OTHER RECOMMENDATIONS 12/31/2014  Recommended Consults (None)  Oral Care Recommendations Oral care Q4 per protocol  Other Recommendations (None)     CHL IP FOLLOW UP RECOMMENDATIONS 12/31/2014  Follow up Recommendations Skilled Nursing facility     [facility] IP FREQUENCY AND DURATION 12/31/2014  Speech Therapy Frequency (ACUTE ONLY) min 3x week  Treatment Duration 2 weeks     Pertinent Vitals/Pain NA    SLP Swallow Goals No flowsheet data found.  No flowsheet data found.    CHL IP REASON FOR REFERRAL 12/28/2014 Reason for Referral  Objectively evaluate swallowing function     CHL IP ORAL PHASE 12/31/2014  Lips (None)  Tongue (None)  Mucous membranes (None)  Nutritional status (None)  Other (None)  Oxygen therapy (None)  Oral Phase WFL  Oral - Pudding Teaspoon (None)  Oral - Pudding Cup (None)  Oral - Honey Teaspoon (None)  Oral - Honey Cup (None)  Oral - Honey Syringe (None)  Oral - Nectar Teaspoon (None)  Oral - Nectar Cup (None)  Oral - Nectar Straw (None)  Oral - Nectar Syringe (None)  Oral - Ice Chips (None)   Oral - Thin Teaspoon (None)  Oral - Thin Cup (None)  Oral - Thin Straw (None)  Oral - Thin Syringe (None)  Oral - Puree (None)  Oral - Mechanical Soft (None)  Oral - Regular (None)  Oral - Multi-consistency (None)  Oral - Pill (None)  Oral Phase - Comment (None)      CHL IP PHARYNGEAL PHASE 12/31/2014  Pharyngeal Phase Impaired  Pharyngeal - Pudding Teaspoon (None)  Penetration/Aspiration details (pudding teaspoon) (None)  Pharyngeal - Pudding Cup (None)  Penetration/Aspiration details (pudding cup) (None)  Pharyngeal - Honey Teaspoon Delayed swallow initiation;Reduced  airway/laryngeal closure;Reduced epiglottic  inversion;Penetration/Aspiration before  swallow;Penetration/Aspiration during swallow;Moderate  aspiration;Pharyngeal residue - valleculae;Reduced tongue base  retraction  Penetration/Aspiration details (honey teaspoon) Material enters  airway, passes BELOW cords without attempt by patient to eject  out (silent aspiration)  Pharyngeal - Honey Cup Delayed swallow initiation;Reduced  airway/laryngeal closure;Reduced epiglottic  inversion;Penetration/Aspiration before  swallow;Penetration/Aspiration during swallow;Moderate  aspiration;Pharyngeal residue - valleculae;Reduced tongue base  retraction  Penetration/Aspiration details (honey cup) Material enters  airway, passes BELOW cords without attempt by patient to eject  out (silent aspiration)  Pharyngeal - Honey Syringe (None)  Penetration/Aspiration details (honey syringe) (None)  Pharyngeal - Nectar Teaspoon NT  Penetration/Aspiration details (nectar teaspoon) (None)  Pharyngeal - Nectar Cup Delayed swallow initiation;Reduced  airway/laryngeal closure;Reduced epiglottic  inversion;Penetration/Aspiration before swallow;Moderate  aspiration;Pharyngeal residue - valleculae;Reduced tongue base  retraction  Penetration/Aspiration details (nectar cup) Material enters  airway, passes BELOW cords and not ejected out despite cough  attempt by patient;Material  enters airway, passes BELOW cords  without attempt by patient to eject out (silent aspiration)  Pharyngeal - Nectar Straw (None)  Penetration/Aspiration details (nectar straw) (None)  Pharyngeal - Nectar Syringe (None)  Penetration/Aspiration details (nectar syringe) (None)  Pharyngeal - Ice Chips (None)  Penetration/Aspiration details (ice chips) (None)  Pharyngeal - Thin Teaspoon (None)  Penetration/Aspiration details (thin teaspoon) (None)  Pharyngeal - Thin Cup NT  Penetration/Aspiration details (thin cup) (None)  Pharyngeal - Thin Straw NT  Penetration/Aspiration details (thin straw) (None)  Pharyngeal - Thin Syringe (None)  Penetration/Aspiration details (thin syringe') (None)  Pharyngeal - Puree Delayed swallow initiation;Reduced  airway/laryngeal closure;Reduced epiglottic  inversion;Penetration/Aspiration before  swallow;Penetration/Aspiration during swallow;Moderate  aspiration;Pharyngeal residue - valleculae  Penetration/Aspiration details (puree) Material enters airway,  passes BELOW cords without attempt by patient to eject out  (silent aspiration)  Pharyngeal - Mechanical Soft Pharyngeal residue -  valleculae;Reduced tongue base retraction;Reduced epiglottic  inversion;Delayed swallow initiation  Penetration/Aspiration details (mechanical soft) (None)  Pharyngeal - Regular NT  Penetration/Aspiration details (regular) (None)  Pharyngeal - Multi-consistency (None)  Penetration/Aspiration details (multi-consistency) (None)  Pharyngeal - Pill NT Penetration/Aspiration details (pill) (None)   Pharyngeal Comment chin tuck attempted with puree and honey, did  not improve function     No flowsheet data found.  No flowsheet data found.        Harlon Ditty, MA CCC-SLP [phone]  Dyanne Iha Riley Nearing 12/31/2014, 2:12 PM    Dg Swallowing Func-speech Pathology  12/28/2014   Carolan Shiver, CCC-SLP     12/28/2014  2:04 PM Objective Swallowing Evaluation: Modified Barium Swallowing Study   Patient  Details  Name: Edward [name] MRN: [id number] Date of Birth: 1946/07/04  Today's Date: 12/28/2014 Time: 1100-1130 SLP Time  Calculation (min) (ACUTE ONLY): 30 min  Past Medical History:  Past Medical History  Diagnosis Date  . Hypertension   . Hypothyroidism   . Diabetes mellitus without complication    Past Surgical History:  Past Surgical History  Procedure Laterality Date  . Chest tube placement     HPI:  Pt is 69 y.o. 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  He reports  swallowing difficulty immediately post-stroke, but not in recent  years.  Poor performance during clinical swallow eval with  concerns for aspiration, so MBS recommended.        Assessment / Plan / Recommendation Clinical Impression  Dysphagia Diagnosis: Mild-mod pharyngeal phase dysphagia  Clinical impression: Pt presents with a mild-mod pharyngeal  dysphagia marked by premature spillage of POs into pharynx,  immediate aspiration of thin liquids (not consistently  accompanied by a cough), and mild vallecular residue of solids  post-swallow.  Pt with fluctuating alertness during study,  requiring frequent prompts to remain awake.  Compensatory  techniques were not effective due to mentation.  Recommend  initiating a dysphagia 3 diet with nectar-thick liquids; meds  crushed with puree.  SLP to follow for diet toleration and  advancement.     Treatment Recommendation  Therapy as outlined in treatment plan below    Diet Recommendation Dysphagia 3 (Mechanical Soft);Nectar-thick  liquid   Liquid Administration via: Cup Medication Administration: Crushed with puree Supervision: Full supervision/cueing for compensatory strategies Compensations: Slow rate;Small sips/bites (hold tray if coughing) Postural Changes and/or Swallow Maneuvers: Seated upright 90  degrees    Other  Recommendations Oral Care Recommendations: Oral care BID   Follow Up Recommendations   (tba)    Frequency and Duration min  3x week  2 weeks        SLP Swallow Goals     General Date of Onset: 12/26/14     Type of Study: Modified Barium Swallowing Study Reason for Referral: Objectively evaluate swallowing function Previous Swallow Assessment: clinical swallow evaluation 12/28/14 Diet Prior to this Study: NPO Temperature Spikes Noted: No Respiratory Status: Room air History of Recent Intubation: No Behavior/Cognition: Lethargic Oral Cavity - Dentition: Adequate natural dentition Self-Feeding Abilities: Able to feed self Patient Positioning: Upright in chair Baseline Vocal Quality: Wet Volitional Cough: Weak;Congested Volitional Swallow: Able to elicit Anatomy:  (presence of likely osteophytes C4-5 - no radiologist  present to confirm) Pharyngeal Secretions: Not observed secondary MBS    Reason for Referral Objectively evaluate swallowing function   Oral Phase Oral Preparation/Oral Phase Oral Phase: WFL   Pharyngeal Phase Pharyngeal Phase Pharyngeal Phase: Impaired Pharyngeal - Nectar Pharyngeal - Nectar Cup: Premature spillage to valleculae;Reduced  tongue base retraction Pharyngeal - Thin Pharyngeal - Thin Cup: Premature spillage to pyriform  sinuses;Reduced airway/laryngeal closure;Penetration/Aspiration  during swallow;Penetration/Aspiration after swallow;Trace  aspiration Penetration/Aspiration details (thin cup): Material enters  airway, passes BELOW cords without attempt by patient to eject  out (silent aspiration);Material enters airway, passes BELOW  cords and not ejected out despite cough attempt by patient Pharyngeal - Thin Straw: Premature spillage to pyriform  sinuses;Reduced airway/laryngeal closure;Penetration/Aspiration  during swallow;Penetration/Aspiration after swallow;Trace  aspiration Penetration/Aspiration details (thin straw): Material enters  airway, passes BELOW cords and not ejected out despite cough  attempt by patient;Material enters airway, passes BELOW cords  without attempt by patient to eject out (silent  aspiration) Pharyngeal - Solids Pharyngeal - Puree: Premature spillage to valleculae;Reduced  tongue base retraction;Pharyngeal residue -  valleculae Pharyngeal - Regular: Premature spillage to valleculae;Reduced  tongue base retraction;Pharyngeal residue - valleculae     Amanda L. Samson Frederic, [place] CCC/SLP Pager [phone]               Blenda Mounts Laurice 12/28/2014, 2:03 PM     Microbiology: Recent Results (from the past 240 hour(s))  Culture, respiratory (NON-Expectorated)     Status: None   Collection Time: 12/28/14  8:11 PM  Result Value Ref Range Status   Specimen Description TRACHEAL ASPIRATE  Final   Special Requests NONE  Final   Gram Stain   Final    NO WBC SEEN NO SQUAMOUS EPITHELIAL CELLS SEEN NO ORGANISMS SEEN Performed at [facility]    Culture   Final    Non-Pathogenic Oropharyngeal-type Flora Isolated. Performed at [facility]    Report Status 01/01/2015 FINAL  Final     Labs: Basic Metabolic Panel:  Recent Labs Lab 01/01/15 0610 01/02/15 0500 01/03/15 0631 01/04/15 0650 01/05/15 0550 01/06/15 0451  NA 146* 140 139 137 135 135  K 3.7 3.6 3.6 3.4* 4.0 4.1  CL 109 104 99 97 96 95*  CO2 GLUCOSE 75 178* 124* 116* 112* 159*  BUN 60* 58* 62* 67* 69* 76*  CREATININE 4.88* 4.65* 4.63* 4.91* 5.09* 5.51*  CALCIUM 7.2* 7.3* 7.0* 6.9* 6.7* 7.1*  MG 1.7  --   --   --   --   --   PHOS 6.4*  --  5.6* 5.3* 5.6* 6.0*   Liver Function Tests:  Recent Labs Lab 01/03/15 0631 01/04/15 0650 01/05/15 0550 01/06/15 0451  ALBUMIN 2.7* 2.6* 2.3* 2.5*   No results for input(s): LIPASE, AMYLASE in the last 168 hours. No results for input(s): AMMONIA in the last 168 hours. CBC:  Recent Labs Lab 01/02/15 0500 01/03/15 1325 01/04/15 0650 01/05/15 0550 01/06/15 0451  WBC 17.5* 15.0* 13.5* 10.6* 12.4*  HGB 8.9* 8.6* 8.4* 7.7* 7.9*  HCT 27.1* 26.4* 25.8* 23.6* 23.8*  MCV 90.9 91.0 90.2 89.4 89.8  PLT 169 166 161 166 167   Cardiac  Enzymes: No results for input(s): CKTOTAL, CKMB, CKMBINDEX, TROPONINI in the last 168 hours. BNP: BNP (last 3 results) No results for input(s): PROBNP in the last 8760 hours. CBG:  Recent Labs Lab 01/05/15 0741 01/05/15 1219 01/05/15 1654 01/05/15 2054 01/06/15 0735  GLUCAP 100* 140* 169* 182* 144*       Signed:  HERNANDEZ ACOSTA,ESTELA  [facility] Pager: [phone] 01/06/2015, 10:51 AM

## 2015-01-06 NOTE — Progress Notes (Signed)
Made several attempts to administer scheduled 10pm medications over a two hour time period. Patient refusing medications. States, "I just laid down and I'm not getting up. I haven't slept in five days." Educated patient. Verbalizes understanding. Continues to refuse. Will continue to monitor.

## 2015-01-07 LAB — RENAL FUNCTION PANEL
Albumin: 2.2 g/dL — ABNORMAL LOW (ref 3.5–5.2)
Anion gap: 11 (ref 5–15)
BUN: 84 mg/dL — AB (ref 6–23)
CO2: 28 mmol/L (ref 19–32)
CREATININE: 5.92 mg/dL — AB (ref 0.50–1.35)
Calcium: 6.9 mg/dL — ABNORMAL LOW (ref 8.4–10.5)
Chloride: 94 mmol/L — ABNORMAL LOW (ref 96–112)
GFR calc Af Amer: 10 mL/min — ABNORMAL LOW (ref >90)
GFR, EST NON AFRICAN AMERICAN: 9 mL/min — AB (ref >90)
GLUCOSE: 136 mg/dL — AB (ref 70–99)
POTASSIUM: 4.6 mmol/L (ref 3.5–5.1)
Phosphorus: 6.9 mg/dL — ABNORMAL HIGH (ref 2.3–4.6)
Sodium: 133 mmol/L — ABNORMAL LOW (ref 135–145)

## 2015-01-07 LAB — GLUCOSE, CAPILLARY
GLUCOSE-CAPILLARY: 115 mg/dL — AB (ref 70–99)
Glucose-Capillary: 275 mg/dL — ABNORMAL HIGH (ref 70–99)

## 2015-01-07 NOTE — Progress Notes (Signed)
Pt prepared for d/c to SNF. IV d/c'd. Skin intact except as most recently charted. Vitals are stable. Report [facility] to [facility] ([facility] Pt to be transported by ambulance service.  Peri MarisAndrew Merrel Crabbe, MBA, BS, RN

## 2015-01-07 NOTE — Progress Notes (Signed)
Unsure why patient stayed an extra night as I was not notified by SW yesterday that patient was unable to DC to SNF. Per RN, patient stable overnight and no new issues, and will DC to SNF this am. No changes to DC Summary dated 01/06/2015.  Peggye PittEstela Hernandez, MD [facility] Pager: [phone]

## 2015-01-08 NOTE — Social Work (Signed)
CSW completed patient's discharge to SNF. RN will call [facility] for patient transport.  Ernst Cumpston J Robt Okuda MSW, LCSW [phone]  

## 2015-04-16 DEATH — deceased

## 2015-05-26 IMAGING — CR DG CHEST 1V PORT
1 series · 1 of 1 positions shown · non-contrast
Comparison: Single view of the chest 12/26/2014.

CLINICAL DATA: Central line placement.

EXAM:
PORTABLE CHEST - 1 VIEW

[AP]
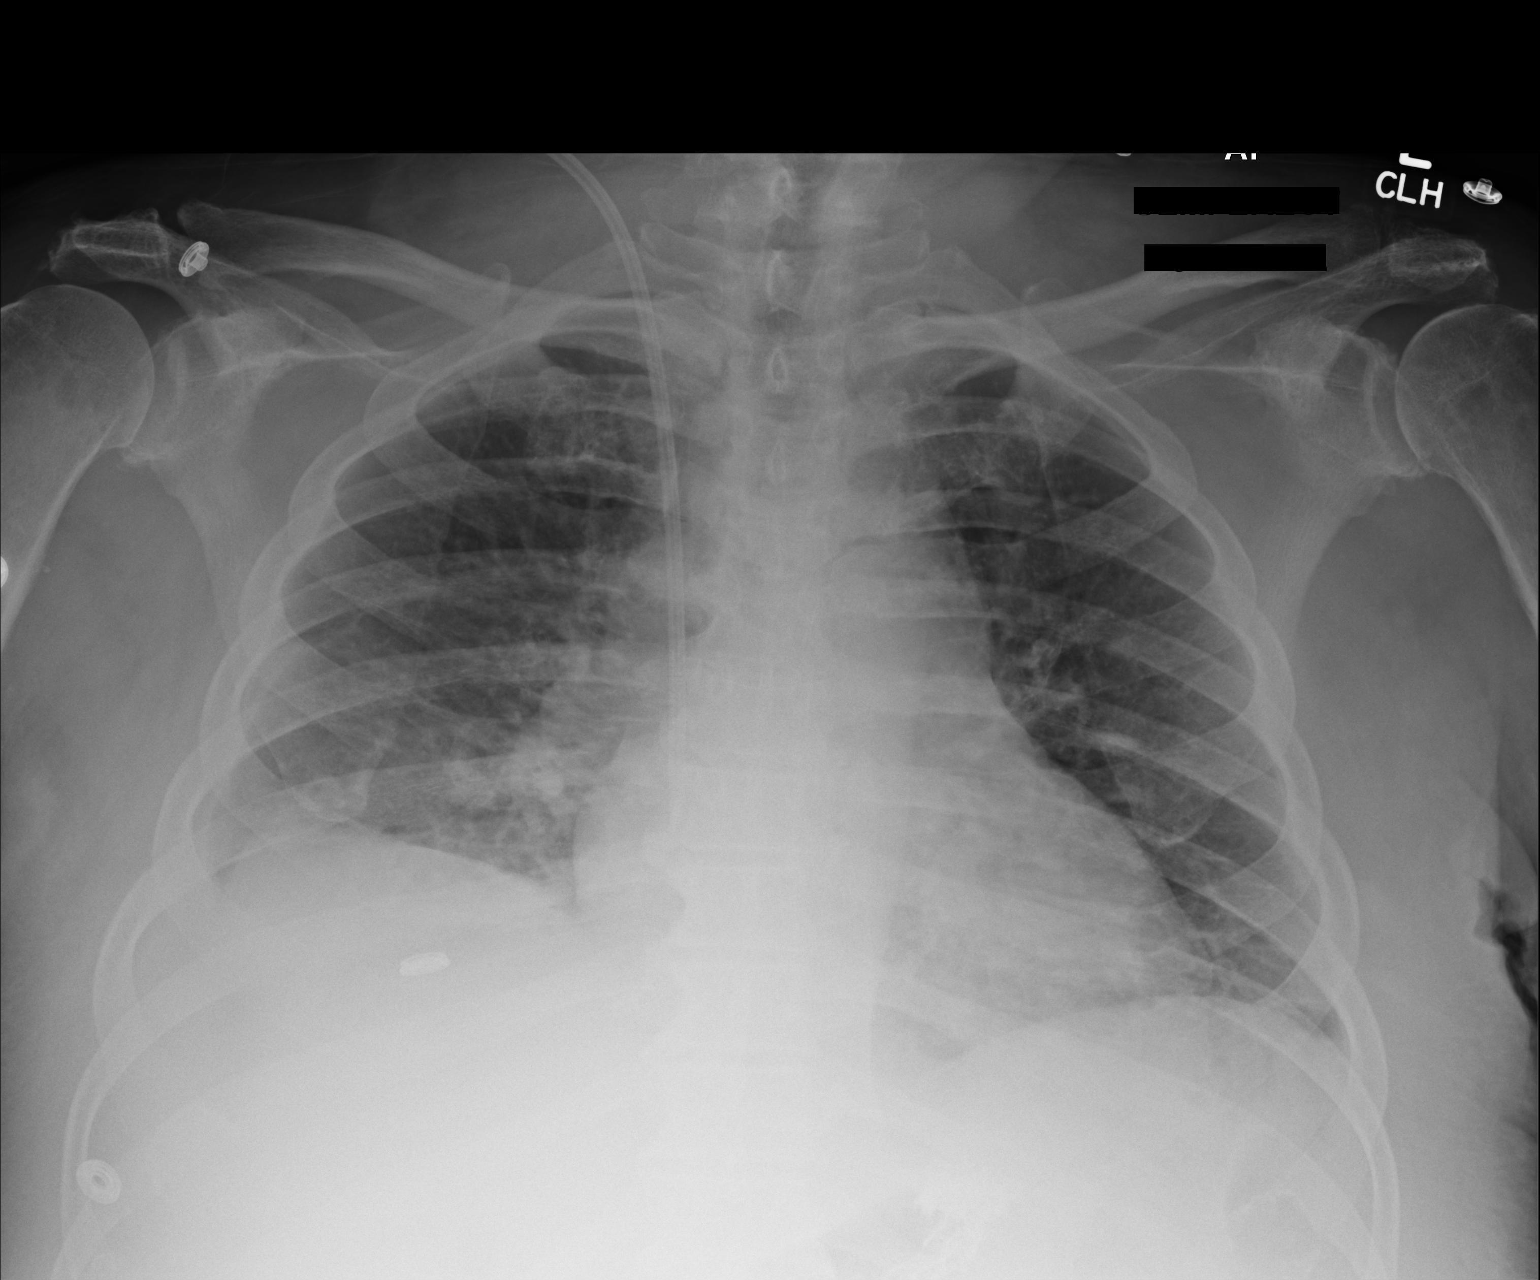

[1 of 1 positions shown; findings below may reference images not displayed]

FINDINGS: Right IJ catheter is in place with the tip just within the right
atrium. The tube could be withdrawn 2 cm for positioning at the
superior cavoatrial junction. Lung volumes are low with right
basilar airspace disease again seen. Heart size is normal. There is
no pneumothorax. Right pleural effusion is noted.
IMPRESSION: Right IJ catheter tip projects within the right atrium. The line
could be withdrawn 2 cm for positioning of the superior cavoatrial
junction. Negative for pneumothorax.

No change in right effusion and basilar airspace disease.

## 2015-05-26 IMAGING — CR DG ABD PORTABLE 1V
1 series · 1 of 1 positions shown · non-contrast
Comparison: None.

CLINICAL DATA: Orogastric tube placement.  Initial encounter.

EXAM:
PORTABLE ABDOMEN - 1 VIEW

[AP]
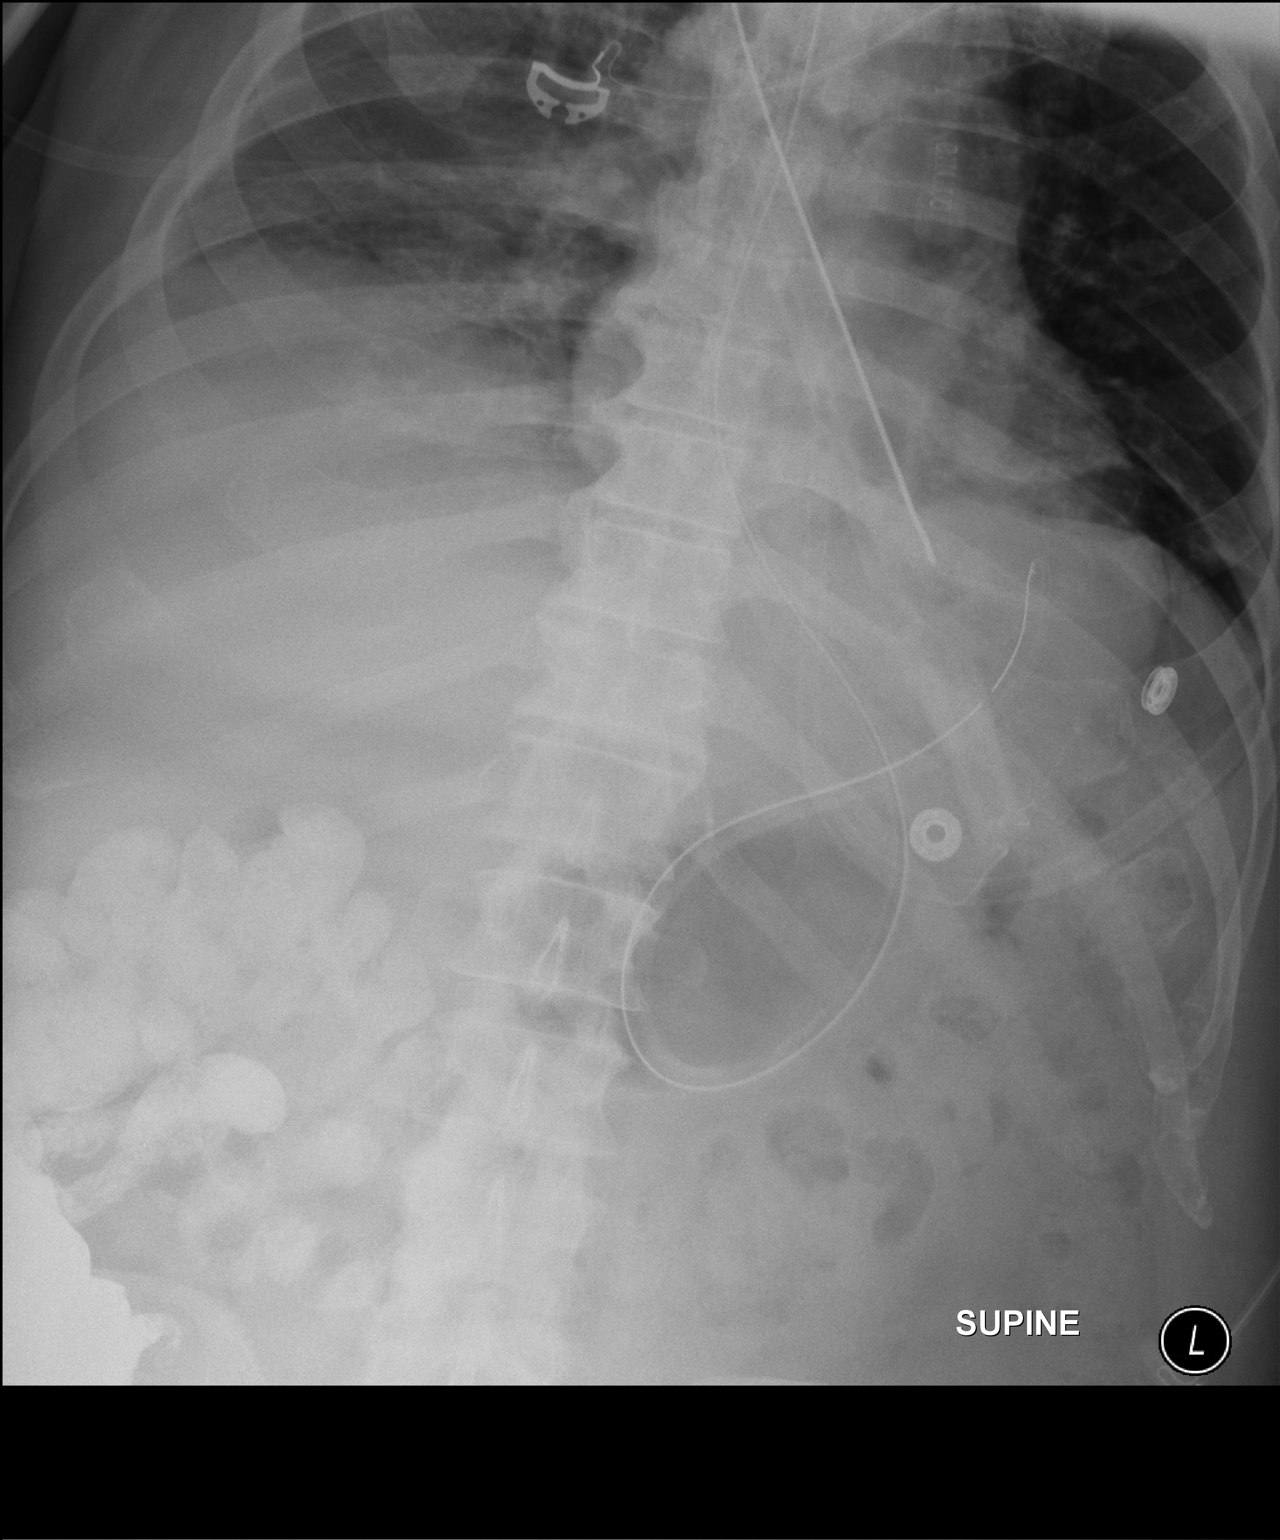

[1 of 1 positions shown; findings below may reference images not displayed]

FINDINGS: The patient's enteric tube is noted coiling at the body of the
stomach and ending overlying the fundus of the stomach.

The visualized bowel gas pattern is grossly unremarkable. Contrast
is seen filling the cecum, ascending colon and base of the appendix.
Mild right basilar airspace opacity may reflect atelectasis. No
acute osseous abnormalities are seen.
IMPRESSION: 1. Enteric tube noted coiling at the body of the stomach and ending
overlying the fundus of the stomach.
2. Mild right basilar airspace opacity may reflect atelectasis.
